# Patient Record
Sex: Male | Born: 1945 | Race: Black or African American | Hispanic: No | Marital: Married | Smoking: Former smoker
Health system: Southern US, Community
[De-identification: ages and names within clinical notes are randomized; demographics above are authoritative.]

## PROBLEM LIST (undated history)

## (undated) DIAGNOSIS — I10 Essential (primary) hypertension: Secondary | ICD-10-CM

## (undated) DIAGNOSIS — G20A1 Parkinson's disease without dyskinesia, without mention of fluctuations: Secondary | ICD-10-CM

## (undated) DIAGNOSIS — G20 Parkinson's disease: Secondary | ICD-10-CM

## (undated) HISTORY — PX: NO PAST SURGERIES: SHX2092

## (undated) HISTORY — DX: Parkinson's disease: G20

---

## 2000-01-01 DIAGNOSIS — I69351 Hemiplegia and hemiparesis following cerebral infarction affecting right dominant side: Secondary | ICD-10-CM | POA: Insufficient documentation

## 2001-05-27 ENCOUNTER — Emergency Department (HOSPITAL_COMMUNITY): Admission: EM | Admit: 2001-05-27 | Discharge: 2001-05-27 | Payer: Self-pay | Admitting: Emergency Medicine

## 2015-01-14 ENCOUNTER — Ambulatory Visit: Payer: Self-pay | Admitting: Physician Assistant

## 2015-01-14 LAB — DOT URINE DIP
Blood: NEGATIVE
Glucose,UR: NEGATIVE
Protein: NEGATIVE
Specific Gravity: 1.005 (ref 1.000–1.030)

## 2016-08-21 ENCOUNTER — Ambulatory Visit
Admission: EM | Admit: 2016-08-21 | Discharge: 2016-08-21 | Disposition: A | Discharge status: Home / Self Care | Payer: Medicare Other | Attending: Family Medicine | Admitting: Family Medicine

## 2016-08-21 DIAGNOSIS — Z029 Encounter for administrative examinations, unspecified: Secondary | ICD-10-CM

## 2016-08-21 DIAGNOSIS — Z024 Encounter for examination for driving license: Secondary | ICD-10-CM

## 2016-08-21 HISTORY — DX: Essential (primary) hypertension: I10

## 2016-08-21 LAB — DEPT OF TRANSP DIPSTICK, URINE (ARMC ONLY)
GLUCOSE, UA: NEGATIVE mg/dL
HGB URINE DIPSTICK: NEGATIVE
Protein, ur: NEGATIVE mg/dL
Specific Gravity, Urine: 1.01 (ref 1.005–1.030)

## 2016-08-21 NOTE — ED Triage Notes (Signed)
Patient is here for DOT physical. Patient has no other complaints at this time.  

## 2016-08-21 NOTE — ED Provider Notes (Signed)
CSN: [id number]     Arrival date & time 08/21/16  1105 History   First MD Initiated Contact with Patient 08/21/16 1216     Chief Complaint  Patient presents with  . Commercial Driver's License Exam   (Consider location/radiation/quality/duration/timing/severity/associated sxs/prior Treatment) HPI  This a 70 year old male presents for a DOT physical. He has a history of hypertension that is controlled by hydrochlorothiazide. I have reviewed his medical history in detail where he had several syncopal episodes that were evaluated in detail and found to be on the basis of medications causing orthostatic hypotension. When the medications were changed he has not had episodes since that period of time. Eustachian he has had a cardiology workup including echocardiogram and a recent nuclear stress test which showed a showed a normal EF no evidence of coronary artery disease. His only medication is for the hydrochlorothiazide.    Past Medical History:  Diagnosis Date  . Hypertension    Past Surgical History:  Procedure Laterality Date  . NO PAST SURGERIES     Family History  Problem Relation Age of Onset  . Dementia Mother   . Heart attack Father [id number]   Social History  Substance Use Topics  . Smoking status: Former Smoker    Quit date: 08/21/2010  . Smokeless tobacco: Never Used  . Alcohol use No    Review of Systems  All other systems reviewed and are negative.   Allergies  Review of patient's allergies indicates no known allergies.  Home Medications   Prior to Admission medications   Medication Sig Start Date End Date Taking? Authorizing Provider  hydrochlorothiazide (HYDRODIURIL) 25 MG tablet Take 25 mg by mouth daily.   Yes Historical Provider, MD   Meds Ordered and Administered this Visit  Medications - No data to display  BP 134/87 (BP Location: Left Arm)   Pulse 81   Temp 98 F (36.7 C) (Oral)   Resp 16   Ht 5' 11.5" (1.816 m)   Wt 181 lb (82.1 kg)   SpO2 99%   BMI  24.89 kg/m  No data found.   Physical Exam  Constitutional: He appears well-developed and well-nourished.  Please refer to DOT physical sheet  Skin: He is not diaphoretic.  Nursing note and vitals reviewed.   Urgent Care Course   Clinical Course    Procedures (including critical care time)  Labs Review Labs Reviewed  DEPT OF TRANSP DIPSTICK, URINE([facility] ONLY)    Imaging Review No results found.   Visual Acuity Review  Right Eye Distance: 20/30 Left Eye Distance: 20/40 Bilateral Distance: 20/25  Right Eye Near:   Left Eye Near:    Bilateral Near:         MDM   1. Driver's permit PE (physical examination)    Patient qualifies for one year certificate with the provision for wearing corrective lenses when driving. His previous syncopal episodes and abnormal echocardiogram were fully evaluated and his syncopal episodes have abated since the medication changes. Nuclear stress test  confirmed a normal EF and no evidence of coronary artery disease. Based on these findings I have given him a one-year certificate.    Lutricia FeilWilliam P Cashel Bellina, PA-C 08/21/16 1325

## 2017-03-28 ENCOUNTER — Ambulatory Visit
Admission: EM | Admit: 2017-03-28 | Discharge: 2017-03-28 | Disposition: A | Discharge status: Home / Self Care | Payer: Medicare Other | Attending: Family Medicine | Admitting: Family Medicine

## 2017-03-28 ENCOUNTER — Ambulatory Visit: Payer: Medicare Other

## 2017-03-28 DIAGNOSIS — J439 Emphysema, unspecified: Secondary | ICD-10-CM | POA: Diagnosis not present

## 2017-03-28 DIAGNOSIS — R0602 Shortness of breath: Secondary | ICD-10-CM

## 2017-03-28 DIAGNOSIS — I700 Atherosclerosis of aorta: Secondary | ICD-10-CM | POA: Insufficient documentation

## 2017-03-28 DIAGNOSIS — R0600 Dyspnea, unspecified: Secondary | ICD-10-CM | POA: Diagnosis not present

## 2017-03-28 DIAGNOSIS — R0902 Hypoxemia: Secondary | ICD-10-CM | POA: Diagnosis not present

## 2017-03-28 DIAGNOSIS — I10 Essential (primary) hypertension: Secondary | ICD-10-CM | POA: Insufficient documentation

## 2017-03-28 DIAGNOSIS — Z87891 Personal history of nicotine dependence: Secondary | ICD-10-CM | POA: Insufficient documentation

## 2017-03-28 MED ORDER — AZITHROMYCIN 500 MG PO TABS: ORAL_TABLET | ORAL | 0 refills | Status: DC

## 2017-03-28 MED ORDER — PREDNISONE 10 MG (21) PO TBPK: ORAL_TABLET | ORAL | 0 refills | Status: DC

## 2017-03-28 MED ORDER — METHYLPREDNISOLONE SODIUM SUCC 125 MG IJ SOLR
125.0000 mg | Freq: Once | INTRAMUSCULAR | Status: AC
  Administered 2017-03-28: 125 mg via INTRAMUSCULAR

## 2017-03-28 MED ORDER — HYDROCOD POLST-CPM POLST ER 10-8 MG/5ML PO SUER: 5.0000 mL | Freq: Two times a day (BID) | ORAL | 0 refills | Status: DC | PRN

## 2017-03-28 MED ORDER — IPRATROPIUM-ALBUTEROL 0.5-2.5 (3) MG/3ML IN SOLN
3.0000 mL | Freq: Once | RESPIRATORY_TRACT | Status: AC
  Administered 2017-03-28: 3 mL via RESPIRATORY_TRACT

## 2017-03-28 MED ORDER — ALBUTEROL SULFATE HFA 108 (90 BASE) MCG/ACT IN AERS: 2.0000 | INHALATION_SPRAY | RESPIRATORY_TRACT | 0 refills | Status: DC | PRN

## 2017-03-28 NOTE — ED Provider Notes (Signed)
[facility]    CSN: [id number] Arrival date & time: 03/28/17  1734     History   Chief Complaint Chief Complaint  Patient presents with  . Shortness of Breath    HPI Jerome Wolfe is a 71 y.o. male.   Patient is here because shortness of breath. According to him and his wife he started getting short of breath on Tuesday. He's never had history of asthma shortness of breath and difficulty breathing. He started smoking at 75 he smoked colorectal basis until about 5 years ago when he stopped. He is in between PCPs at this time. He has never been poor and inhaler albuterol treatment or shortness of breath. States he is coughing up some clear material but nothing this really sick however his wife states that for the last 3 days he is progressively got short of breath and she can hear wheezing at night. No known drug allergies and once again he just stopped smoking about 5 years. No known drug allergies. No previous surgeries operations. Other than hypertension no chronic medical problems. Mother had dementia father had heart attack.   The history is provided by the patient and the spouse.  Shortness of Breath  Severity:  Severe Duration:  3 days Timing:  Constant Progression:  Worsening Chronicity:  New Context: activity, URI and weather changes   Context: not smoke exposure   Relieved by:  Nothing Worsened by:  Nothing Ineffective treatments:  None tried Associated symptoms: wheezing   Associated symptoms: no chest pain   Risk factors: tobacco use     Past Medical History:  Diagnosis Date  . Hypertension     There are no active problems to display for this patient.   Past Surgical History:  Procedure Laterality Date  . NO PAST SURGERIES         Home Medications    Prior to Admission medications   Medication Sig Start Date End Date Taking? Authorizing Provider  hydrochlorothiazide (HYDRODIURIL) 25 MG tablet Take 25 mg by mouth daily.    Historical  Provider, MD    Family History Family History  Problem Relation Age of Onset  . Dementia Mother   . Heart attack Father 61    Social History Social History  Substance Use Topics  . Smoking status: Former Smoker    Quit date: 08/21/2010  . Smokeless tobacco: Never Used  . Alcohol use No     Allergies   Patient has no known allergies.   Review of Systems Review of Systems  Constitutional: Positive for activity change, appetite change and fatigue.  Respiratory: Positive for chest tightness, shortness of breath and wheezing.   Cardiovascular: Negative for chest pain.     Physical Exam Triage Vital Signs ED Triage Vitals [03/28/17 1740]  Enc Vitals Group     BP (!) 169/96     Pulse Rate (!) 107     Resp 18     Temp      Temp Source Oral     SpO2 94 %     Weight      Height      Head Circumference      Peak Flow      Pain Score 0     Pain Loc      Pain Edu?      Excl. in GC?    No data found.   Updated Vital Signs BP (!) 169/96 (BP Location: Left Arm)   Pulse (!) 107  Resp 18   SpO2 97%   Visual Acuity Right Eye Distance:   Left Eye Distance:   Bilateral Distance:    Right Eye Near:   Left Eye Near:    Bilateral Near:     Physical Exam  Constitutional: He appears well-developed and well-nourished. No distress.  HENT:  Head: Normocephalic and atraumatic.  Right Ear: External ear normal.  Left Ear: External ear normal.  Eyes: EOM are normal. Pupils are equal, round, and reactive to light.  Neck: Normal range of motion. Neck supple. No tracheal deviation present.  Cardiovascular: Exam reveals distant heart sounds.   Pulmonary/Chest: He has decreased breath sounds. He has wheezes.  Musculoskeletal: Normal range of motion.  Neurological: He is alert.  Skin: Skin is warm.  Vitals reviewed.    UC Treatments / Results  Labs (all labs ordered are listed, but only abnormal results are displayed) Labs Reviewed - No data to display  EKG  EKG  Interpretation None       Radiology No results found.  Procedures Procedures (including critical care time)  Medications Ordered in UC Medications  methylPREDNISolone sodium succinate (SOLU-MEDROL) 125 mg/2 mL injection 125 mg (not administered)  ipratropium-albuterol (DUONEB) 0.5-2.5 (3) MG/3ML nebulizer solution 3 mL (3 mLs Nebulization Given 03/28/17 1743)     Initial Impression / Assessment and Plan / UC Course  I have reviewed the triage vital signs and the nursing notes.  Pertinent labs & imaging results that were available during my care of the patient were reviewed by me and considered in my medical decision making (see chart for details).   patient is markedly improved from when he first arrived he still has shortness of breath and his pulse ox is ranging from 93-96% will also administer 125 mg Solu-Medrol and his rate being given a DuoNeb treatment. Will obtain a chest x-ray place him on 6 day course of prednisone and antibiotics will be determined by what the chest x-ray shows. Pneumonia Levaquin no pneumonia we'll place him on Zithromax 500 mg daily for 5 days may give him a even a test next prescription with albuterol inhaler. Main thing though as I talked to him and his wife extensively this mass smoked for 45+ years he stopped smoking 5 years ago and started when he was [age]. He has never been diagnosed as COPD he's never had shortness of breath issue but I explained to him and his wife that even though he stopped smoking 5 years ago sometimes takes years for the COPD symptoms to start and S1 think is going on now strongly suggest he follow-up with PCP ASAP so that he might go as COPD medication and have further testing to diagnose whether this is truly COPD or not    Final Clinical Impressions(s) / UC Diagnoses   Final diagnoses:  Shortness of breath  Dyspnea, unspecified type  Hypoxia    New Prescriptions New Prescriptions   No medications on file  Note: This  dictation was prepared with Dragon dictation along with smaller phrase technology. Any transcriptional errors that result from this process are unintentional.  Chest x-ray did show emphysematous changes will place him on Zithromax Tussionex albuterol inhaler and prednisone by mouth    Hassan RowanEugene Mauriah Mcmillen, MD 03/28/17 1907

## 2017-03-28 NOTE — ED Triage Notes (Signed)
Pt c/o shortness of breath for 2 days following chest congestion.

## 2019-07-21 DIAGNOSIS — I714 Abdominal aortic aneurysm, without rupture, unspecified: Secondary | ICD-10-CM | POA: Diagnosis present

## 2019-08-22 ENCOUNTER — Emergency Department: Payer: Medicare Other

## 2019-08-22 ENCOUNTER — Inpatient Hospital Stay
Admission: EM | Admit: 2019-08-22 | Discharge: 2019-08-25 | DRG: 065 | Disposition: A | Discharge status: Skilled Nursing Facility | Payer: Medicare Other | Attending: Internal Medicine | Admitting: Internal Medicine

## 2019-08-22 ENCOUNTER — Observation Stay: Payer: Medicare Other

## 2019-08-22 ENCOUNTER — Other Ambulatory Visit: Payer: Self-pay

## 2019-08-22 DIAGNOSIS — Z87891 Personal history of nicotine dependence: Secondary | ICD-10-CM

## 2019-08-22 DIAGNOSIS — F4310 Post-traumatic stress disorder, unspecified: Secondary | ICD-10-CM | POA: Diagnosis present

## 2019-08-22 DIAGNOSIS — R4781 Slurred speech: Secondary | ICD-10-CM | POA: Diagnosis present

## 2019-08-22 DIAGNOSIS — Z79899 Other long term (current) drug therapy: Secondary | ICD-10-CM

## 2019-08-22 DIAGNOSIS — Z20828 Contact with and (suspected) exposure to other viral communicable diseases: Secondary | ICD-10-CM | POA: Diagnosis present

## 2019-08-22 DIAGNOSIS — I6381 Other cerebral infarction due to occlusion or stenosis of small artery: Principal | ICD-10-CM | POA: Diagnosis present

## 2019-08-22 DIAGNOSIS — Z820 Family history of epilepsy and other diseases of the nervous system: Secondary | ICD-10-CM

## 2019-08-22 DIAGNOSIS — I10 Essential (primary) hypertension: Secondary | ICD-10-CM | POA: Diagnosis present

## 2019-08-22 DIAGNOSIS — E785 Hyperlipidemia, unspecified: Secondary | ICD-10-CM | POA: Diagnosis present

## 2019-08-22 DIAGNOSIS — Z8249 Family history of ischemic heart disease and other diseases of the circulatory system: Secondary | ICD-10-CM

## 2019-08-22 DIAGNOSIS — I452 Bifascicular block: Secondary | ICD-10-CM | POA: Diagnosis present

## 2019-08-22 DIAGNOSIS — I639 Cerebral infarction, unspecified: Secondary | ICD-10-CM | POA: Diagnosis present

## 2019-08-22 DIAGNOSIS — G8191 Hemiplegia, unspecified affecting right dominant side: Secondary | ICD-10-CM | POA: Diagnosis present

## 2019-08-22 DIAGNOSIS — R4587 Impulsiveness: Secondary | ICD-10-CM | POA: Diagnosis not present

## 2019-08-22 DIAGNOSIS — G459 Transient cerebral ischemic attack, unspecified: Secondary | ICD-10-CM

## 2019-08-22 DIAGNOSIS — R29703 NIHSS score 3: Secondary | ICD-10-CM | POA: Diagnosis present

## 2019-08-22 DIAGNOSIS — Z8489 Family history of other specified conditions: Secondary | ICD-10-CM

## 2019-08-22 LAB — COMPREHENSIVE METABOLIC PANEL
ALT: 16 U/L (ref 0–44)
AST: 23 U/L (ref 15–41)
Albumin: 4.6 g/dL (ref 3.5–5.0)
Alkaline Phosphatase: 67 U/L (ref 38–126)
Anion gap: 11 (ref 5–15)
BUN: 15 mg/dL (ref 8–23)
CO2: 23 mmol/L (ref 22–32)
Calcium: 9.7 mg/dL (ref 8.9–10.3)
Chloride: 104 mmol/L (ref 98–111)
Creatinine, Ser: 1.12 mg/dL (ref 0.61–1.24)
GFR calc Af Amer: >60 mL/min (ref >60)
GFR calc non Af Amer: >60 mL/min (ref >60)
Glucose, Bld: 97 mg/dL (ref 70–99)
Potassium: 3.8 mmol/L (ref 3.5–5.1)
Sodium: 138 mmol/L (ref 135–145)
Total Bilirubin: 0.7 mg/dL (ref 0.3–1.2)
Total Protein: 8.4 g/dL — ABNORMAL HIGH (ref 6.5–8.1)

## 2019-08-22 LAB — URINALYSIS, COMPLETE (UACMP) WITH MICROSCOPIC
Bacteria, UA: NONE SEEN
Bilirubin Urine: NEGATIVE
Glucose, UA: NEGATIVE mg/dL
Hgb urine dipstick: NEGATIVE
Ketones, ur: NEGATIVE mg/dL
Leukocytes,Ua: NEGATIVE
Nitrite: NEGATIVE
Protein, ur: NEGATIVE mg/dL
Specific Gravity, Urine: 1.01 (ref 1.005–1.030)
Squamous Epithelial / LPF: NONE SEEN (ref 0–5)
pH: 6 (ref 5.0–8.0)

## 2019-08-22 LAB — CBC WITH DIFFERENTIAL/PLATELET
Abs Immature Granulocytes: 0.02 10*3/uL (ref 0.00–0.07)
Basophils Absolute: 0.1 10*3/uL (ref 0.0–0.1)
Basophils Relative: 1 %
Eosinophils Absolute: 0.1 10*3/uL (ref 0.0–0.5)
Eosinophils Relative: 2 %
HCT: 46.2 % (ref 39.0–52.0)
Hemoglobin: 14.5 g/dL (ref 13.0–17.0)
Immature Granulocytes: 0 %
Lymphocytes Relative: 27 %
Lymphs Abs: 2.3 10*3/uL (ref 0.7–4.0)
MCH: 21.7 pg — ABNORMAL LOW (ref 26.0–34.0)
MCHC: 31.4 g/dL (ref 30.0–36.0)
MCV: 69.2 fL — ABNORMAL LOW (ref 80.0–100.0)
Monocytes Absolute: 0.8 10*3/uL (ref 0.1–1.0)
Monocytes Relative: 10 %
Neutro Abs: 5.2 10*3/uL (ref 1.7–7.7)
Neutrophils Relative %: 60 %
Platelets: 267 10*3/uL (ref 150–400)
RBC: 6.68 MIL/uL — ABNORMAL HIGH (ref 4.22–5.81)
RDW: 17.7 % — ABNORMAL HIGH (ref 11.5–15.5)
Smear Review: NORMAL
WBC Morphology: ABNORMAL
WBC: 8.6 10*3/uL (ref 4.0–10.5)
nRBC: 0 % (ref 0.0–0.2)

## 2019-08-22 MED ORDER — ALBUTEROL SULFATE HFA 108 (90 BASE) MCG/ACT IN AERS
2.0000 | INHALATION_SPRAY | RESPIRATORY_TRACT | Status: DC | PRN
Start: 2019-08-22 — End: 2019-08-26
  Filled 2019-08-22: qty 6.7

## 2019-08-22 MED ORDER — SENNOSIDES-DOCUSATE SODIUM 8.6-50 MG PO TABS: 1.0000 | ORAL_TABLET | Freq: Every evening | ORAL | Status: DC | PRN

## 2019-08-22 MED ORDER — ACETAMINOPHEN 160 MG/5ML PO SOLN
650.0000 mg | ORAL | Status: DC | PRN
  Filled 2019-08-22: qty 20.3

## 2019-08-22 MED ORDER — LORAZEPAM 2 MG/ML IJ SOLN: 1.0000 mg | Freq: Once | INTRAMUSCULAR | Status: DC | PRN

## 2019-08-22 MED ORDER — ATORVASTATIN CALCIUM 20 MG PO TABS
40.0000 mg | ORAL_TABLET | Freq: Every day | ORAL | Status: DC
  Administered 2019-08-22: 40 mg via ORAL
  Filled 2019-08-22: qty 2

## 2019-08-22 MED ORDER — STROKE: EARLY STAGES OF RECOVERY BOOK
Freq: Once | Status: AC
  Administered 2019-08-22: 18:00:00

## 2019-08-22 MED ORDER — ASPIRIN EC 81 MG PO TBEC
81.0000 mg | DELAYED_RELEASE_TABLET | Freq: Every day | ORAL | Status: DC
  Administered 2019-08-23 – 2019-08-25 (×3): 81 mg via ORAL
  Filled 2019-08-22 (×4): qty 1

## 2019-08-22 MED ORDER — ASPIRIN 325 MG PO TABS
325.0000 mg | ORAL_TABLET | Freq: Once | ORAL | Status: AC
  Administered 2019-08-22: 325 mg via ORAL
  Filled 2019-08-22: qty 1

## 2019-08-22 MED ORDER — ENOXAPARIN SODIUM 40 MG/0.4ML ~~LOC~~ SOLN
40.0000 mg | SUBCUTANEOUS | Status: DC
  Administered 2019-08-22 – 2019-08-25 (×4): 40 mg via SUBCUTANEOUS
  Filled 2019-08-22 (×4): qty 0.4

## 2019-08-22 MED ORDER — ACETAMINOPHEN 325 MG PO TABS: 650.0000 mg | ORAL_TABLET | ORAL | Status: DC | PRN

## 2019-08-22 MED ORDER — ASPIRIN 300 MG RE SUPP: 300.0000 mg | Freq: Once | RECTAL | Status: AC

## 2019-08-22 MED ORDER — ACETAMINOPHEN 650 MG RE SUPP: 650.0000 mg | RECTAL | Status: DC | PRN

## 2019-08-22 NOTE — ED Notes (Signed)
Pt taken to CT via stretcher, wife at bedside.

## 2019-08-22 NOTE — ED Notes (Signed)
Dr. Brett Albino at bedside.   Pt had urinated on self a little. Wet clothes removed, given dry brief. Finished urinating into urinal.

## 2019-08-22 NOTE — Progress Notes (Signed)
Family Meeting Note  Advance Directive:yes  Today a meeting took place with the Patient and spouse.  Patient is able to participate.  The following clinical team members were present during this meeting:MD  The following were discussed:Patient's diagnosis: TIA, Patient's progosis: Unable to determine and Goals for treatment: Full Code  Additional follow-up to be provided: prn  Time spent during discussion:20 minutes  Evette Doffing, MD

## 2019-08-22 NOTE — ED Triage Notes (Signed)
Pt arrives via [facility] from home for "stroke". Family told EMS that pt has been having weakness on L side and then have weakness on R side. Also intermittent speech problems. States hx dementia, COPD. Symptoms x 3-4 days. Has had a fall recently, was able to move into position to fall on bottom per EMS.   Pt arrives to ED denying any weakness at this time. States earlier speech was slurred but states he feels his speech is normal at this time. Denies vision problems. Moving all extremities. EMS states he did walk with assistance at home. Denies pain.   EMS VS: 155/95, HR 97, 97% RA, CBG 96.

## 2019-08-22 NOTE — ED Notes (Signed)
Pt given a warm blanket 

## 2019-08-22 NOTE — ED Provider Notes (Signed)
[facility] Emergency Department Provider Note    First MD Initiated Contact with Patient 08/22/19 1328     (approximate)  I have reviewed the triage vital signs and the nursing notes.   HISTORY  Chief Complaint Weakness    HPI Jerome Wolfe is a 73 y.o. male below listed past medical history presents the ER for intermittent slurred speech as well as stumbling gait.  States he was having intermittent episodes of this last night.  States he had more profound episode this morning that was witnessed by his wife who is currently present with the patient.  He denies any pain.  States that he does feel weak.  Wife states that he felt very warm to touch and was having chills last night.  No new medication changes.  Does not take any blood thinners.  No history of stroke.    Past Medical History:  Diagnosis Date  . Hypertension    Family History  Problem Relation Age of Onset  . Dementia Mother   . Heart attack Father 70   Past Surgical History:  Procedure Laterality Date  . NO PAST SURGERIES     There are no active problems to display for this patient.     Prior to Admission medications   Medication Sig Start Date End Date Taking? Authorizing Provider  albuterol (PROVENTIL HFA;VENTOLIN HFA) 108 (90 Base) MCG/ACT inhaler Inhale 2 puffs into the lungs every 4 (four) hours as needed for wheezing or shortness of breath. 03/28/17   Frederich Cha, MD  azithromycin (ZITHROMAX) 500 MG tablet 1 tablet daily for 5 days 03/28/17   Frederich Cha, MD  chlorpheniramine-HYDROcodone [facility] PENNKINETIC ER) 10-8 MG/5ML SUER Take 5 mLs by mouth every 12 (twelve) hours as needed for cough. 03/28/17   Frederich Cha, MD  hydrochlorothiazide (HYDRODIURIL) 25 MG tablet Take 25 mg by mouth daily.    [provider]  predniSONE (STERAPRED UNI-PAK 21 TAB) 10 MG (21) TBPK tablet Sig 6 tablet day 1, 5 tablets day 2, 4 tablets day 3,,3tablets day 4, 2 tablets day 5, 1 tablet day  6 take all tablets orally 03/28/17   Frederich Cha, MD    Allergies Patient has no known allergies.    Social History Social History   Tobacco Use  . Smoking status: Former Smoker    Quit date: 08/21/2010    Years since quitting: 9.0  . Smokeless tobacco: Never Used  Substance Use Topics  . Alcohol use: No  . Drug use: No    Review of Systems Patient denies headaches, rhinorrhea, blurry vision, numbness, shortness of breath, chest pain, edema, cough, abdominal pain, nausea, vomiting, diarrhea, dysuria, fevers, rashes or hallucinations unless otherwise stated above in HPI. ____________________________________________   PHYSICAL EXAM:  VITAL SIGNS: Vitals:   08/22/19 1336 08/22/19 1402  BP: (!) 160/134   Pulse:    Resp:    Temp:  98.5 F (36.9 C)  SpO2:      Constitutional: Alert and oriented.  Eyes: Conjunctivae are normal.  Head: Atraumatic. Nose: No congestion/rhinnorhea. Mouth/Throat: Mucous membranes are moist.   Neck: No stridor. Painless ROM.  Cardiovascular: Normal rate, regular rhythm. Grossly normal heart sounds.  Good peripheral circulation. Respiratory: Normal respiratory effort.  No retractions. Lungs CTAB. Gastrointestinal: Soft and nontender. No distention. No abdominal bruits. No CVA tenderness. Genitourinary: deferred Musculoskeletal: No lower extremity tenderness nor edema.  No joint effusions. Neurologic:  CN- intact.  No facial droop, Normal FNF.  Normal heel to shin.  Sensation intact bilaterally. Normal speech and language. No gross focal neurologic deficits are appreciated. No gait instability. Skin:  Skin is warm, dry and intact. No rash noted. Psychiatric: Mood and affect are normal. Speech and behavior are normal.  ____________________________________________   LABS (all labs ordered are listed, but only abnormal results are displayed)  Results for orders placed or performed during the hospital encounter of 08/22/19 (from the past 24  hour(s))  CBC with Differential/Platelet     Status: Abnormal   Collection Time: 08/22/19  1:34 PM  Result Value Ref Range   WBC 8.6 4.0 - 10.5 K/uL   RBC 6.68 (H) 4.22 - 5.81 MIL/uL   Hemoglobin 14.5 13.0 - 17.0 g/dL   HCT 16.146.2 09.639.0 - 04.552.0 %   MCV 69.2 (L) 80.0 - 100.0 fL   MCH 21.7 (L) 26.0 - 34.0 pg   MCHC 31.4 30.0 - 36.0 g/dL   RDW 40.917.7 (H) 81.111.5 - 91.415.5 %   Platelets 267 150 - 400 K/uL   nRBC 0.0 0.0 - 0.2 %   Neutrophils Relative % 60 %   Neutro Abs 5.2 1.7 - 7.7 K/uL   Lymphocytes Relative 27 %   Lymphs Abs 2.3 0.7 - 4.0 K/uL   Monocytes Relative 10 %   Monocytes Absolute 0.8 0.1 - 1.0 K/uL   Eosinophils Relative 2 %   Eosinophils Absolute 0.1 0.0 - 0.5 K/uL   Basophils Relative 1 %   Basophils Absolute 0.1 0.0 - 0.1 K/uL   WBC Morphology Abnormal lymphocytes present    RBC Morphology MORPHOLOGY UNREMARKABLE    Smear Review Normal platelet morphology    Immature Granulocytes 0 %   Abs Immature Granulocytes 0.02 0.00 - 0.07 K/uL  Comprehensive metabolic panel     Status: Abnormal   Collection Time: 08/22/19  1:34 PM  Result Value Ref Range   Sodium 138 135 - 145 mmol/L   Potassium 3.8 3.5 - 5.1 mmol/L   Chloride 104 98 - 111 mmol/L   CO2 23 22 - 32 mmol/L   Glucose, Bld 97 70 - 99 mg/dL   BUN 15 8 - 23 mg/dL   Creatinine, Ser 7.821.12 0.61 - 1.24 mg/dL   Calcium 9.7 8.9 - 95.610.3 mg/dL   Total Protein 8.4 (H) 6.5 - 8.1 g/dL   Albumin 4.6 3.5 - 5.0 g/dL   AST 23 15 - 41 U/L   ALT 16 0 - 44 U/L   Alkaline Phosphatase 67 38 - 126 U/L   Total Bilirubin 0.7 0.3 - 1.2 mg/dL   GFR calc non Af Amer >60 >60 mL/min   GFR calc Af Amer >60 >60 mL/min   Anion gap 11 5 - 15  Urinalysis, Complete w Microscopic     Status: Abnormal   Collection Time: 08/22/19  1:53 PM  Result Value Ref Range   Color, Urine YELLOW (A) YELLOW   APPearance CLEAR (A) CLEAR   Specific Gravity, Urine 1.010 1.005 - 1.030   pH 6.0 5.0 - 8.0   Glucose, UA NEGATIVE NEGATIVE mg/dL   Hgb urine dipstick  NEGATIVE NEGATIVE   Bilirubin Urine NEGATIVE NEGATIVE   Ketones, ur NEGATIVE NEGATIVE mg/dL   Protein, ur NEGATIVE NEGATIVE mg/dL   Nitrite NEGATIVE NEGATIVE   Leukocytes,Ua NEGATIVE NEGATIVE   WBC, UA 0-5 0 - 5 WBC/hpf   Bacteria, UA NONE SEEN NONE SEEN   Squamous Epithelial / LPF NONE SEEN 0 - 5   ____________________________________________  EKG My review and personal interpretation  at Time: 13:33   Indication: slurred speech, weakness  Rate: 95  Rhythm: sinus Axis: left Other: rbbb, lafb, no stemi, nonspecific st abn ____________________________________________  RADIOLOGY  I personally reviewed all radiographic images ordered to evaluate for the above acute complaints and reviewed radiology reports and findings.  These findings were personally discussed with the patient.  Please see medical record for radiology report.  ____________________________________________   PROCEDURES  Procedure(s) performed:  Procedures    Critical Care performed: no ____________________________________________   INITIAL IMPRESSION / ASSESSMENT AND PLAN / ED COURSE  Pertinent labs & imaging results that were available during my care of the patient were reviewed by me and considered in my medical decision making (see chart for details).   DDX: Dehydration, sepsis, pna, uti, hypoglycemia, cva, drug effect, withdrawal, encephalitis   Harden Moddie Lepera is a 73 y.o. who presents to the ED with symptoms as described above.  Symptoms with onset outside of the window.  Does not meet criteria for code stroke.  Blood work sent for by differential was reassuring.  No evidence of cystitis.  Is not febrile.  No history suggestive withdrawal or medication effect.  Does not seem consistent with encephalitis.  Given the episodic nature of his symptoms with lateralizing features that is since resolved certainly concerning for TIA.  Will discuss with hospitalist for admission.     The patient was evaluated in  Emergency Department today for the symptoms described in the history of present illness. He/she was evaluated in the context of the global COVID-19 pandemic, which necessitated consideration that the patient might be at risk for infection with the SARS-CoV-2 virus that causes COVID-19. Institutional protocols and algorithms that pertain to the evaluation of patients at risk for COVID-19 are in a state of rapid change based on information released by regulatory bodies including the CDC and federal and state organizations. These policies and algorithms were followed during the patient's care in the ED.  As part of my medical decision making, I reviewed the following data within the electronic MEDICAL RECORD NUMBER Nursing notes reviewed and incorporated, Labs reviewed, notes from prior ED visits and [place] Controlled Substance Database   ____________________________________________   FINAL CLINICAL IMPRESSION(S) / ED DIAGNOSES  Final diagnoses:  TIA (transient ischemic attack)      NEW MEDICATIONS STARTED DURING THIS VISIT:  New Prescriptions   No medications on file     Note:  This document was prepared using Dragon voice recognition software and may include unintentional dictation errors.    Willy Eddyobinson, Ming Mcmannis, MD 08/22/19 [phone]

## 2019-08-22 NOTE — H&P (Signed)
[facility] [place] [facility]   PATIENT NAME: Jerome Wolfe    MR#:  [id number]  DATE OF BIRTH:  1946/07/01  DATE OF ADMISSION:  08/22/2019  PRIMARY CARE PHYSICIAN: [facility]   REQUESTING/REFERRING PHYSICIAN: Willy EddyPatrick Robinson, MD  CHIEF COMPLAINT:   Chief Complaint  Patient presents with   Weakness    HISTORY OF PRESENT ILLNESS:  Jerome Wolfe  is a 73 y.o. male with a known history of hypertension who presented to the ED with slurred speech and right sided weakness that started around 8 AM this morning.  Per wife, patient began having slurred speech when they were drinking coffee this morning.  He then went to go comb his hair, but could not lift his right arm up to his head.  He denies any numbness or weakness of his arms or legs.  Patient does note that prior to going to bed last night, he felt some generalized weakness in all of his extremities.  He denies any vision changes.  His symptoms resolved after about 1-2 hours.  In the ED, he was hypertensive to 165/93. CT head was negative. CXR did not show any acute abnormalities. Hospitalists were called for admission.  PAST MEDICAL HISTORY:   Past Medical History:  Diagnosis Date   Hypertension     PAST SURGICAL HISTORY:   Past Surgical History:  Procedure Laterality Date   NO PAST SURGERIES      SOCIAL HISTORY:   Social History   Tobacco Use   Smoking status: Former Smoker    Quit date: 08/21/2010    Years since quitting: 9.0   Smokeless tobacco: Never Used  Substance Use Topics   Alcohol use: No    FAMILY HISTORY:   Family History  Problem Relation Age of Onset   Dementia Mother    Heart attack Father 2655    DRUG ALLERGIES:  No Known Allergies  REVIEW OF SYSTEMS:   Review of Systems  Constitutional: Negative for chills and fever.  HENT: Negative for congestion and sore throat.   Eyes: Negative for blurred vision and double vision.  Respiratory: Negative  for cough and shortness of breath.   Cardiovascular: Negative for chest pain and palpitations.  Gastrointestinal: Negative for nausea and vomiting.  Genitourinary: Negative for dysuria and urgency.  Musculoskeletal: Negative for back pain and neck pain.  Neurological: Positive for speech change and focal weakness. Negative for dizziness, tingling and headaches.  Psychiatric/Behavioral: Negative for depression. The patient is not nervous/anxious.     MEDICATIONS AT HOME:   Prior to Admission medications   Medication Sig Start Date End Date Taking? Authorizing Provider  albuterol (PROVENTIL HFA;VENTOLIN HFA) 108 (90 Base) MCG/ACT inhaler Inhale 2 puffs into the lungs every 4 (four) hours as needed for wheezing or shortness of breath. 03/28/17   Hassan RowanWade, Eugene, MD  azithromycin (ZITHROMAX) 500 MG tablet 1 tablet daily for 5 days 03/28/17   Hassan RowanWade, Eugene, MD  chlorpheniramine-HYDROcodone [facility](TUSSIONEX PENNKINETIC ER) 10-8 MG/5ML SUER Take 5 mLs by mouth every 12 (twelve) hours as needed for cough. 03/28/17   Hassan RowanWade, Eugene, MD  hydrochlorothiazide (HYDRODIURIL) 25 MG tablet Take 25 mg by mouth daily.    [provider]  predniSONE (STERAPRED UNI-PAK 21 TAB) 10 MG (21) TBPK tablet Sig 6 tablet day 1, 5 tablets day 2, 4 tablets day 3,,3tablets day 4, 2 tablets day 5, 1 tablet day 6 take all tablets orally 03/28/17   Hassan RowanWade, Eugene, MD  VITAL SIGNS:  Blood pressure (!) 147/81, pulse 95, temperature 98.5 F (36.9 C), temperature source Rectal, resp. rate (!) 31, height 6\' 2"  (1.88 m), weight 80.7 kg, SpO2 97 %.  PHYSICAL EXAMINATION:  Physical Exam  GENERAL:  73 y.o.-year-old patient lying in the bed with no acute distress.  EYES: Pupils equal, round, reactive to light and accommodation. No scleral icterus. Extraocular muscles intact.  HEENT: Head atraumatic, normocephalic. Oropharynx and nasopharynx clear.  NECK:  Supple, no jugular venous distention. No thyroid enlargement, no tenderness.   LUNGS: Normal breath sounds bilaterally, no wheezing, rales,rhonchi or crepitation. No use of accessory muscles of respiration.  CARDIOVASCULAR: RRR, S1, S2 normal. No murmurs, rubs, or gallops.  ABDOMEN: Soft, nontender, nondistended. Bowel sounds present. No organomegaly or mass.  EXTREMITIES: No pedal edema, cyanosis, or clubbing.  NEUROLOGIC: Cranial nerves II through XII are intact. Muscle strength 5/5 in all extremities. Sensation intact. Gait not checked.  Speech normal. PSYCHIATRIC: The patient is alert and oriented x 3.  SKIN: No obvious rash, lesion, or ulcer.   LABORATORY PANEL:   CBC Recent Labs  Lab 08/22/19 1334  WBC 8.6  HGB 14.5  HCT 46.2  PLT 267   ------------------------------------------------------------------------------------------------------------------  Chemistries  Recent Labs  Lab 08/22/19 1334  NA 138  K 3.8  CL 104  CO2 23  GLUCOSE 97  BUN 15  CREATININE 1.12  CALCIUM 9.7  AST 23  ALT 16  ALKPHOS 67  BILITOT 0.7   ------------------------------------------------------------------------------------------------------------------  Cardiac Enzymes No results for input(s): TROPONINI in the last 168 hours. ------------------------------------------------------------------------------------------------------------------  RADIOLOGY:  Ct Head Wo Contrast  Result Date: 08/22/2019 CLINICAL DATA:  Weakness, intermittent speech difficulty, dementia EXAM: CT HEAD WITHOUT CONTRAST TECHNIQUE: Contiguous axial images were obtained from the base of the skull through the vertex without intravenous contrast. COMPARISON:  None. FINDINGS: Brain: No evidence of acute infarction, hemorrhage, hydrocephalus, extra-axial collection or mass lesion/mass effect. Subcortical white matter and periventricular small vessel ischemic changes. Vascular: No hyperdense vessel or unexpected calcification. Skull: Normal. Negative for fracture or focal lesion. Sinuses/Orbits: The  visualized paranasal sinuses are essentially clear. The mastoid air cells are unopacified. Other: None. IMPRESSION: No evidence of acute intracranial abnormality. Small vessel ischemic changes. Electronically Signed   By: Julian Hy M.D.   On: 08/22/2019 14:15   Dg Chest Portable 1 View  Result Date: 08/22/2019 CLINICAL DATA:  AMS, evaluate for pneumonia EXAM: PORTABLE CHEST 1 VIEW COMPARISON:  03/28/2017 FINDINGS: The heart size and mediastinal contours are within normal limits. Emphysema. Probable mild bibasilar fibrotic change. No acute appearing airspace opacity. The visualized skeletal structures are unremarkable. IMPRESSION: Emphysema. Probable mild bibasilar fibrotic change. No acute appearing airspace opacity. Electronically Signed   By: Kolson Candle M.D.   On: 08/22/2019 14:24      IMPRESSION AND PLAN:   Slurred speech/right arm weakness- likely TIA.  Symptoms lasted for 1-2 hours and then resolved spontaneously. -CT head negative -Will obtain MRI brain -ECHO ([facility]) and carotid dopplers -Neurology consult -A1c and lipid panel  -Start daily aspirin and lipitor -PT/OT/SLP -Cardiac monitoring  Hypertension-BP mildly elevated in the ED -Will allow for permissive hypertension -Holding home hydrochlorothiazide for now   All the records are reviewed and case discussed with ED provider. Management plans discussed with the patient, family and they are in agreement.  CODE STATUS: full  TOTAL TIME TAKING CARE OF THIS PATIENT: 45 minutes.    Berna Spare Alonia Dibuono M.D on 08/22/2019 at 2:53 PM  Between 7am  to 6pm - Pager - [phone]  After 6pm go to [email or web address] - Social research officer, governmentpassword [email or web address] [email or web address]  [facility] [place] [facility]  Office  [phone]  CC: Primary care physician; [facility]   Note: This dictation was prepared with Dragon dictation along with smaller phrase technology. Any transcriptional errors that result from this process are unintentional.

## 2019-08-22 NOTE — ED Notes (Signed)
ED TO INPATIENT HANDOFF REPORT  ED Nurse Name and Phone #:  Jae DireKate & Alyssa [phone]  S Name/Age/Gender Jerome Wolfe 73 y.o. male Room/Bed: ED13A/ED13A  Code Status   Code Status: Not on file  Home/SNF/Other Home Patient oriented to: self, place, time and situation Is this baseline? Yes   Triage Complete: Triage complete  Chief Complaint weakness  Triage Note Pt arrives via [facility] from home for "stroke". Family told EMS that pt has been having weakness on L side and then have weakness on R side. Also intermittent speech problems. States hx dementia, COPD. Symptoms x 3-4 days. Has had a fall recently, was able to move into position to fall on bottom per EMS.   Pt arrives to ED denying any weakness at this time. States earlier speech was slurred but states he feels his speech is normal at this time. Denies vision problems. Moving all extremities. EMS states he did walk with assistance at home. Denies pain.   EMS VS: 155/95, HR 97, 97% RA, CBG 96.    Allergies No Known Allergies  Level of Care/Admitting Diagnosis ED Disposition    ED Disposition Condition Comment   Admit  Hospital Area: [facility] [[id number]]  Level of Care: Med-Surg [16]  Covid Evaluation: Asymptomatic Screening Protocol (No Symptoms)  Diagnosis: Slurred speech [161096][[id number]]  Admitting Physician: Willadean CarolMAYO, KATY DODD [[id number]]  Attending Physician: Willadean CarolMAYO, KATY DODD [[id number]]  PT Class (Do Not Modify): Observation [104]  PT Acc Code (Do Not Modify): Observation [10022]       B Medical/Surgery History Past Medical History:  Diagnosis Date  . Hypertension    Past Surgical History:  Procedure Laterality Date  . NO PAST SURGERIES       A IV Location/Drains/Wounds Patient Lines/Drains/Airways Status   Active Line/Drains/Airways    Name:   Placement date:   Placement time:   Site:   Days:   Peripheral IV 08/22/19 Left Antecubital   08/22/19    1336    Antecubital   less than 1           Intake/Output Last 24 hours No intake or output data in the 24 hours ending 08/22/19 1632  Labs/Imaging Results for orders placed or performed during the hospital encounter of 08/22/19 (from the past 48 hour(s))  CBC with Differential/Platelet     Status: Abnormal   Collection Time: 08/22/19  1:34 PM  Result Value Ref Range   WBC 8.6 4.0 - 10.5 K/uL   RBC 6.68 (H) 4.22 - 5.81 MIL/uL   Hemoglobin 14.5 13.0 - 17.0 g/dL   HCT 29.546.2 62.139.0 - 30.852.0 %   MCV 69.2 (L) 80.0 - 100.0 fL   MCH 21.7 (L) 26.0 - 34.0 pg   MCHC 31.4 30.0 - 36.0 g/dL   RDW 65.717.7 (H) 84.611.5 - 96.215.5 %   Platelets 267 150 - 400 K/uL   nRBC 0.0 0.0 - 0.2 %   Neutrophils Relative % 60 %   Neutro Abs 5.2 1.7 - 7.7 K/uL   Lymphocytes Relative 27 %   Lymphs Abs 2.3 0.7 - 4.0 K/uL   Monocytes Relative 10 %   Monocytes Absolute 0.8 0.1 - 1.0 K/uL   Eosinophils Relative 2 %   Eosinophils Absolute 0.1 0.0 - 0.5 K/uL   Basophils Relative 1 %   Basophils Absolute 0.1 0.0 - 0.1 K/uL   WBC Morphology Abnormal lymphocytes present    RBC Morphology MORPHOLOGY UNREMARKABLE    Smear Review Normal platelet morphology  Immature Granulocytes 0 %   Abs Immature Granulocytes 0.02 0.00 - 0.07 K/uL    Comment: Performed at [facility], [address] [place], [place] [address]  Comprehensive metabolic panel     Status: Abnormal   Collection Time: 08/22/19  1:34 PM  Result Value Ref Range   Sodium 138 135 - 145 mmol/L   Potassium 3.8 3.5 - 5.1 mmol/L   Chloride 104 98 - 111 mmol/L   CO2 23 22 - 32 mmol/L   Glucose, Bld 97 70 - 99 mg/dL   BUN 15 8 - 23 mg/dL   Creatinine, Ser 1.12 0.61 - 1.24 mg/dL   Calcium 9.7 8.9 - 10.3 mg/dL   Total Protein 8.4 (H) 6.5 - 8.1 g/dL   Albumin 4.6 3.5 - 5.0 g/dL   AST 23 15 - 41 U/L   ALT 16 0 - 44 U/L   Alkaline Phosphatase 67 38 - 126 U/L   Total Bilirubin 0.7 0.3 - 1.2 mg/dL   GFR calc non Af Amer >60 >60 mL/min   GFR calc Af Amer >60 >60 mL/min   Anion gap 11 5 - 15     Comment: Performed at [facility], [address], [place], [place] [address]  Urinalysis, Complete w Microscopic     Status: Abnormal   Collection Time: 08/22/19  1:53 PM  Result Value Ref Range   Color, Urine YELLOW (A) YELLOW   APPearance CLEAR (A) CLEAR   Specific Gravity, Urine 1.010 1.005 - 1.030   pH 6.0 5.0 - 8.0   Glucose, UA NEGATIVE NEGATIVE mg/dL   Hgb urine dipstick NEGATIVE NEGATIVE   Bilirubin Urine NEGATIVE NEGATIVE   Ketones, ur NEGATIVE NEGATIVE mg/dL   Protein, ur NEGATIVE NEGATIVE mg/dL   Nitrite NEGATIVE NEGATIVE   Leukocytes,Ua NEGATIVE NEGATIVE   WBC, UA 0-5 0 - 5 WBC/hpf   Bacteria, UA NONE SEEN NONE SEEN   Squamous Epithelial / LPF NONE SEEN 0 - 5    Comment: Performed at [facility], [address] [place], [place] [address]   Ct Head Wo Contrast  Result Date: 08/22/2019 CLINICAL DATA:  Weakness, intermittent speech difficulty, dementia EXAM: CT HEAD WITHOUT CONTRAST TECHNIQUE: Contiguous axial images were obtained from the base of the skull through the vertex without intravenous contrast. COMPARISON:  None. FINDINGS: Brain: No evidence of acute infarction, hemorrhage, hydrocephalus, extra-axial collection or mass lesion/mass effect. Subcortical white matter and periventricular small vessel ischemic changes. Vascular: No hyperdense vessel or unexpected calcification. Skull: Normal. Negative for fracture or focal lesion. Sinuses/Orbits: The visualized paranasal sinuses are essentially clear. The mastoid air cells are unopacified. Other: None. IMPRESSION: No evidence of acute intracranial abnormality. Small vessel ischemic changes. Electronically Signed   By: Julian Hy M.D.   On: 08/22/2019 14:15   Dg Chest Portable 1 View  Result Date: 08/22/2019 CLINICAL DATA:  AMS, evaluate for pneumonia EXAM: PORTABLE CHEST 1 VIEW COMPARISON:  03/28/2017 FINDINGS: The heart size and mediastinal contours are within normal limits. Emphysema. Probable  mild bibasilar fibrotic change. No acute appearing airspace opacity. The visualized skeletal structures are unremarkable. IMPRESSION: Emphysema. Probable mild bibasilar fibrotic change. No acute appearing airspace opacity. Electronically Signed   By: Kourosh Candle M.D.   On: 08/22/2019 14:24    Pending Labs Unresulted Labs (From admission, onward)    Start     Ordered   08/22/19 1444  SARS CORONAVIRUS 2 Nasal Swab Aptima Multi Swab  (Asymptomatic/Tier 2 Patients Labs)  ONCE -  STAT,   STAT    Question Answer Comment  Is this test for diagnosis or screening Screening   Symptomatic for COVID-19 as defined by CDC No   Hospitalized for COVID-19 No   Admitted to ICU for COVID-19 No   Previously tested for COVID-19 No   Resident in a congregate (group) care setting No   Employed in healthcare setting No      08/22/19 1443   Signed and Held  Hemoglobin A1c  Tomorrow morning,   R     Signed and Held   Signed and Held  Lipid panel  Tomorrow morning,   R    Comments: Fasting    Signed and Held   Signed and Held  CBC  Tomorrow morning,   R     Signed and Held   Signed and Held  Basic metabolic panel  Tomorrow morning,   R     Signed and Held          Vitals/Pain Today's Vitals   08/22/19 1400 08/22/19 1402 08/22/19 1456 08/22/19 1500  BP: (!) 147/81  (!) 159/99 (!) 157/103  Pulse:   93 94  Resp: (!) 31  16   Temp:  98.5 F (36.9 C)    TempSrc:  Rectal    SpO2:   98% 97%  Weight:      Height:      PainSc:        Isolation Precautions No active isolations  Medications Medications - No data to display  Mobility walks with person assist Moderate fall risk   Focused Assessments    R Recommendations: See Admitting Provider Note  Report given to:   Additional Notes:

## 2019-08-23 ENCOUNTER — Observation Stay: Payer: Medicare Other

## 2019-08-23 ENCOUNTER — Observation Stay: Admit: 2019-08-23 | Payer: Medicare Other

## 2019-08-23 ENCOUNTER — Inpatient Hospital Stay: Payer: Medicare Other

## 2019-08-23 DIAGNOSIS — Z79899 Other long term (current) drug therapy: Secondary | ICD-10-CM | POA: Diagnosis not present

## 2019-08-23 DIAGNOSIS — G8191 Hemiplegia, unspecified affecting right dominant side: Secondary | ICD-10-CM | POA: Diagnosis present

## 2019-08-23 DIAGNOSIS — I6381 Other cerebral infarction due to occlusion or stenosis of small artery: Secondary | ICD-10-CM | POA: Diagnosis present

## 2019-08-23 DIAGNOSIS — I10 Essential (primary) hypertension: Secondary | ICD-10-CM | POA: Diagnosis present

## 2019-08-23 DIAGNOSIS — Z87891 Personal history of nicotine dependence: Secondary | ICD-10-CM | POA: Diagnosis not present

## 2019-08-23 DIAGNOSIS — G459 Transient cerebral ischemic attack, unspecified: Secondary | ICD-10-CM | POA: Diagnosis present

## 2019-08-23 DIAGNOSIS — R4781 Slurred speech: Secondary | ICD-10-CM

## 2019-08-23 DIAGNOSIS — Z820 Family history of epilepsy and other diseases of the nervous system: Secondary | ICD-10-CM | POA: Diagnosis not present

## 2019-08-23 DIAGNOSIS — I639 Cerebral infarction, unspecified: Secondary | ICD-10-CM | POA: Diagnosis present

## 2019-08-23 DIAGNOSIS — Z20828 Contact with and (suspected) exposure to other viral communicable diseases: Secondary | ICD-10-CM | POA: Diagnosis present

## 2019-08-23 DIAGNOSIS — F4310 Post-traumatic stress disorder, unspecified: Secondary | ICD-10-CM | POA: Diagnosis present

## 2019-08-23 DIAGNOSIS — Z8249 Family history of ischemic heart disease and other diseases of the circulatory system: Secondary | ICD-10-CM | POA: Diagnosis not present

## 2019-08-23 DIAGNOSIS — Z8489 Family history of other specified conditions: Secondary | ICD-10-CM | POA: Diagnosis not present

## 2019-08-23 DIAGNOSIS — R29703 NIHSS score 3: Secondary | ICD-10-CM | POA: Diagnosis present

## 2019-08-23 DIAGNOSIS — R4587 Impulsiveness: Secondary | ICD-10-CM | POA: Diagnosis not present

## 2019-08-23 DIAGNOSIS — I452 Bifascicular block: Secondary | ICD-10-CM | POA: Diagnosis present

## 2019-08-23 DIAGNOSIS — E785 Hyperlipidemia, unspecified: Secondary | ICD-10-CM | POA: Diagnosis present

## 2019-08-23 LAB — BASIC METABOLIC PANEL
Anion gap: 12 (ref 5–15)
BUN: 16 mg/dL (ref 8–23)
CO2: 23 mmol/L (ref 22–32)
Calcium: 9.7 mg/dL (ref 8.9–10.3)
Chloride: 106 mmol/L (ref 98–111)
Creatinine, Ser: 1.21 mg/dL (ref 0.61–1.24)
GFR calc Af Amer: >60 mL/min (ref >60)
GFR calc non Af Amer: 59 mL/min — ABNORMAL LOW (ref >60)
Glucose, Bld: 101 mg/dL — ABNORMAL HIGH (ref 70–99)
Potassium: 3.6 mmol/L (ref 3.5–5.1)
Sodium: 141 mmol/L (ref 135–145)

## 2019-08-23 LAB — LIPID PANEL
Cholesterol: 258 mg/dL — ABNORMAL HIGH (ref 0–200)
HDL: 62 mg/dL (ref >40)
LDL Cholesterol: 179 mg/dL — ABNORMAL HIGH (ref 0–99)
Total CHOL/HDL Ratio: 4.2 RATIO
Triglycerides: 85 mg/dL (ref <150)
VLDL: 17 mg/dL (ref 0–40)

## 2019-08-23 LAB — CBC
HCT: 45.9 % (ref 39.0–52.0)
Hemoglobin: 14.5 g/dL (ref 13.0–17.0)
MCH: 22.1 pg — ABNORMAL LOW (ref 26.0–34.0)
MCHC: 31.6 g/dL (ref 30.0–36.0)
MCV: 69.9 fL — ABNORMAL LOW (ref 80.0–100.0)
Platelets: 272 10*3/uL (ref 150–400)
RBC: 6.57 MIL/uL — ABNORMAL HIGH (ref 4.22–5.81)
RDW: 18 % — ABNORMAL HIGH (ref 11.5–15.5)
WBC: 9.6 10*3/uL (ref 4.0–10.5)
nRBC: 0 % (ref 0.0–0.2)

## 2019-08-23 LAB — HEMOGLOBIN A1C
Hgb A1c MFr Bld: 5.8 % — ABNORMAL HIGH (ref 4.8–5.6)
Mean Plasma Glucose: 119.76 mg/dL

## 2019-08-23 LAB — SARS CORONAVIRUS 2 (TAT 6-24 HRS): SARS Coronavirus 2: NEGATIVE

## 2019-08-23 MED ORDER — CLOPIDOGREL BISULFATE 75 MG PO TABS
75.0000 mg | ORAL_TABLET | Freq: Every day | ORAL | Status: DC
  Administered 2019-08-23 – 2019-08-25 (×3): 75 mg via ORAL
  Filled 2019-08-23 (×3): qty 1

## 2019-08-23 MED ORDER — QUETIAPINE FUMARATE 25 MG PO TABS: 12.5000 mg | ORAL_TABLET | Freq: Every day | ORAL | Status: DC

## 2019-08-23 MED ORDER — MOMETASONE FURO-FORMOTEROL FUM 200-5 MCG/ACT IN AERO
2.0000 | INHALATION_SPRAY | Freq: Two times a day (BID) | RESPIRATORY_TRACT | Status: DC
  Administered 2019-08-23 – 2019-08-25 (×4): 2 via RESPIRATORY_TRACT
  Filled 2019-08-23: qty 8.8

## 2019-08-23 MED ORDER — OLANZAPINE 2.5 MG PO TABS
2.5000 mg | ORAL_TABLET | Freq: Every day | ORAL | Status: DC
  Administered 2019-08-24: 20:00:00 2.5 mg via ORAL
  Filled 2019-08-23 (×3): qty 1

## 2019-08-23 MED ORDER — LATANOPROST 0.005 % OP SOLN
1.0000 [drp] | Freq: Every day | OPHTHALMIC | Status: DC
  Administered 2019-08-24: 1 [drp] via OPHTHALMIC
  Filled 2019-08-23: qty 2.5

## 2019-08-23 MED ORDER — FLUTICASONE PROPIONATE 50 MCG/ACT NA SUSP
2.0000 | Freq: Every day | NASAL | Status: DC
  Administered 2019-08-23 – 2019-08-25 (×3): 2 via NASAL
  Filled 2019-08-23: qty 16

## 2019-08-23 MED ORDER — SERTRALINE HCL 50 MG PO TABS
75.0000 mg | ORAL_TABLET | Freq: Every day | ORAL | Status: DC
  Administered 2019-08-23 – 2019-08-25 (×3): 75 mg via ORAL
  Filled 2019-08-23 (×3): qty 2

## 2019-08-23 MED ORDER — ATORVASTATIN CALCIUM 20 MG PO TABS
80.0000 mg | ORAL_TABLET | Freq: Every day | ORAL | Status: DC
  Administered 2019-08-23 – 2019-08-25 (×3): 80 mg via ORAL
  Filled 2019-08-23 (×3): qty 4

## 2019-08-23 MED ORDER — VITAMIN B-6 50 MG PO TABS
100.0000 mg | ORAL_TABLET | Freq: Every day | ORAL | Status: DC
  Administered 2019-08-23 – 2019-08-25 (×3): 100 mg via ORAL
  Filled 2019-08-23 (×3): qty 2

## 2019-08-23 MED ORDER — VITAMIN B-12 100 MCG PO TABS
100.0000 ug | ORAL_TABLET | Freq: Every day | ORAL | Status: DC
  Administered 2019-08-23 – 2019-08-25 (×3): 100 ug via ORAL
  Filled 2019-08-23 (×3): qty 1

## 2019-08-23 MED ORDER — DORZOLAMIDE HCL 2 % OP SOLN
1.0000 [drp] | Freq: Three times a day (TID) | OPHTHALMIC | Status: DC
  Administered 2019-08-23 – 2019-08-25 (×6): 1 [drp] via OPHTHALMIC
  Filled 2019-08-23: qty 10

## 2019-08-23 MED ORDER — LORAZEPAM 2 MG/ML IJ SOLN
1.0000 mg | Freq: Four times a day (QID) | INTRAMUSCULAR | Status: DC | PRN
  Administered 2019-08-23: 18:00:00 1 mg via INTRAVENOUS
  Filled 2019-08-23: qty 1

## 2019-08-23 MED ORDER — LORAZEPAM 2 MG/ML IJ SOLN
1.0000 mg | Freq: Once | INTRAMUSCULAR | Status: AC
  Administered 2019-08-23: 1 mg via INTRAVENOUS
  Filled 2019-08-23: qty 1

## 2019-08-23 NOTE — Plan of Care (Signed)
  Problem: Education: Goal: Knowledge of disease or condition will improve Outcome: Not Progressing Goal: Knowledge of secondary prevention will improve Outcome: Not Progressing Goal: Knowledge of patient specific risk factors addressed and post discharge goals established will improve Outcome: Not Progressing   Problem: Coping: Goal: Will verbalize positive feelings about self Outcome: Not Progressing Goal: Will identify appropriate support needs Outcome: Not Progressing   Problem: Health Behavior/Discharge Planning: Goal: Ability to manage health-related needs will improve Outcome: Not Progressing   Problem: Self-Care: Goal: Ability to participate in self-care as condition permits will improve Outcome: Not Progressing Goal: Verbalization of feelings and concerns over difficulty with self-care will improve Outcome: Not Progressing Goal: Ability to communicate needs accurately will improve Outcome: Not Progressing   Problem: Ischemic Stroke/TIA Tissue Perfusion: Goal: Complications of ischemic stroke/TIA will be minimized Outcome: Not Progressing   Problem: Education: Goal: Knowledge of General Education information will improve Description: Including pain rating scale, medication(s)/side effects and non-pharmacologic comfort measures Outcome: Not Progressing   Problem: Health Behavior/Discharge Planning: Goal: Ability to manage health-related needs will improve Outcome: Not Progressing   Problem: Clinical Measurements: Goal: Ability to maintain clinical measurements within normal limits will improve Outcome: Not Progressing Goal: Will remain free from infection Outcome: Not Progressing Goal: Diagnostic test results will improve Outcome: Not Progressing Goal: Respiratory complications will improve Outcome: Not Progressing Goal: Cardiovascular complication will be avoided Outcome: Not Progressing   Problem: Activity: Goal: Risk for activity intolerance will  decrease Outcome: Not Progressing   Problem: Nutrition: Goal: Adequate nutrition will be maintained 08/23/2019 1855 by Oris Drone, RN Outcome: Not Progressing 08/23/2019 1454 by Oris Drone, RN Outcome: Not Progressing  Pt with ? Aspiration while using a staw; observed coughing; poor PO intake; started crushing medications and giving with applesauce Problem: Coping: Goal: Level of anxiety will decrease Outcome: Not Progressing   Problem: Elimination: Goal: Will not experience complications related to bowel motility Outcome: Not Progressing Goal: Will not experience complications related to urinary retention Outcome: Not Progressing   Problem: Pain Managment: Goal: General experience of comfort will improve Outcome: Not Progressing   Problem: Safety: Goal: Ability to remain free from injury will improve Outcome: Not Progressing   Problem: Skin Integrity: Goal: Risk for impaired skin integrity will decrease Outcome: Not Progressing

## 2019-08-23 NOTE — Care Management Obs Status (Signed)
[facility] NOTIFICATION   Patient Details  Name: Gevon Markus MRN: [id number] Date of Birth: [birth year]   Medicare Observation Status Notification Given:  Yes    Advika Mclelland A Semaya Vida, RN 08/23/2019, 8:38 AM

## 2019-08-23 NOTE — Progress Notes (Signed)
[facility] - [place] [facility]   PATIENT NAME: Jerome Wolfe    MR#:  [id number]  DATE OF BIRTH:  06-Apr-1946  SUBJECTIVE:  CHIEF COMPLAINT:   Chief Complaint  Patient presents with   Weakness   -Patient seen, wife at bedside this morning.  He appears to be very agitated and restless and decided to leave the hospital. -He is alert oriented but impulsivity noted.   REVIEW OF SYSTEMS:  Review of Systems  Constitutional: Positive for malaise/fatigue. Negative for chills and fever.  HENT: Negative for congestion, ear discharge, hearing loss and nosebleeds.   Eyes: Negative for blurred vision and double vision.  Respiratory: Negative for cough, shortness of breath and wheezing.   Cardiovascular: Negative for chest pain and palpitations.  Gastrointestinal: Negative for abdominal pain, constipation, diarrhea, nausea and vomiting.  Genitourinary: Negative for dysuria.  Musculoskeletal: Negative for myalgias.  Neurological: Positive for speech change and focal weakness. Negative for dizziness, seizures and headaches.  Psychiatric/Behavioral: Negative for depression.    DRUG ALLERGIES:  No Known Allergies  VITALS:  Blood pressure (!) 132/92, pulse 100, temperature 98.5 F (36.9 C), temperature source Oral, resp. rate 18, height 6\' 2"  (1.88 m), weight 80.7 kg, SpO2 98 %.  PHYSICAL EXAMINATION:  Physical Exam   GENERAL:  73 y.o.-year-old patient lying in the bed with no acute distress.  Very restless and impulsive. EYES: Pupils equal, round, reactive to light and accommodation. No scleral icterus. Extraocular muscles intact.  HEENT: Head atraumatic, normocephalic. Oropharynx and nasopharynx clear.  NECK:  Supple, no jugular venous distention. No thyroid enlargement, no tenderness.  LUNGS: Normal breath sounds bilaterally, no wheezing, rales,rhonchi or crepitation. No use of accessory muscles of respiration.  Decreased bibasilar breath sounds CARDIOVASCULAR:  S1, S2 normal. No  rubs, or gallops.  3/6 systolic murmur is present ABDOMEN: Soft, nontender, nondistended. Bowel sounds present. No organomegaly or mass.  EXTREMITIES: No pedal edema, cyanosis, or clubbing.  NEUROLOGIC: Cranial nerves II through XII are intact.  Right facial droop with drooling noted.  Muscle strength equal in all extremities. Sensation intact. Gait not checked.  PSYCHIATRIC: The patient is alert and oriented x 3.  Speech is slightly slurred SKIN: No obvious rash, lesion, or ulcer.    LABORATORY PANEL:   CBC Recent Labs  Lab 08/23/19 0542  WBC 9.6  HGB 14.5  HCT 45.9  PLT 272   ------------------------------------------------------------------------------------------------------------------  Chemistries  Recent Labs  Lab 08/22/19 1334 08/23/19 0542  NA 138 141  K 3.8 3.6  CL 104 106  CO2 23 23  GLUCOSE 97 101*  BUN 15 16  CREATININE 1.12 1.21  CALCIUM 9.7 9.7  AST 23  --   ALT 16  --   ALKPHOS 67  --   BILITOT 0.7  --    ------------------------------------------------------------------------------------------------------------------  Cardiac Enzymes No results for input(s): TROPONINI in the last 168 hours. ------------------------------------------------------------------------------------------------------------------  RADIOLOGY:  Ct Head Wo Contrast  Result Date: 08/22/2019 CLINICAL DATA:  Weakness, intermittent speech difficulty, dementia EXAM: CT HEAD WITHOUT CONTRAST TECHNIQUE: Contiguous axial images were obtained from the base of the skull through the vertex without intravenous contrast. COMPARISON:  None. FINDINGS: Brain: No evidence of acute infarction, hemorrhage, hydrocephalus, extra-axial collection or mass lesion/mass effect. Subcortical white matter and periventricular small vessel ischemic changes. Vascular: No hyperdense vessel or unexpected calcification. Skull: Normal. Negative for fracture or focal lesion. Sinuses/Orbits: The  visualized paranasal sinuses are essentially clear. The mastoid air cells are unopacified. Other: None. IMPRESSION:  No evidence of acute intracranial abnormality. Small vessel ischemic changes. Electronically Signed   By: Charline BillsSriyesh  Krishnan M.D.   On: 08/22/2019 14:15   Mr Brain Wo Contrast  Result Date: 08/22/2019 CLINICAL DATA:  Slurred speech and right-sided weakness starting this morning EXAM: MRI HEAD WITHOUT CONTRAST TECHNIQUE: Multiplanar, multiecho pulse sequences of the brain and surrounding structures were obtained without intravenous contrast. COMPARISON:  None. FINDINGS: Brain: 1 cm ovoid infarct in the posterior limb left internal capsule. Subcentimeter focus of weak diffusion restriction in the right frontal white matter and in the left deep white matter adjacent to the frontal horn of the left lateral ventricle. Background of extensive chronic small vessel ischemic type change the cerebral white matter where there is confluent FLAIR hyperintensity which even extends into the external capsules and temporal lobes. Minor small vessel ischemic type change in the pons. No acute hemorrhage, hydrocephalus, or masslike finding. Isointense nodular appearance along the frontal horn of the left lateral ventricle, favor heterotopic gray matter which would be incidental. Vascular: Major flow voids are preserved Skull and upper cervical spine: Negative for marrow lesion. Sinuses/Orbits: Negative for acute finding. Bilateral cataract resection IMPRESSION: 1. Acute lacunar infarct in the posterior limb left internal capsule. 2. Subacute appearing lacunar infarcts in the bifrontal white matter. 3. 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 Electronically Signed   By: Marnee SpringJonathon  Watts M.D.   On: 08/22/2019 19:13   [place] Carotid Bilateral (at [facility] And Ap Only)  Result Date: 08/23/2019 CLINICAL DATA:  TIA, hypertension and tobacco use. EXAM: BILATERAL CAROTID DUPLEX ULTRASOUND TECHNIQUE: Wallace CullensGray scale imaging,  color Doppler and duplex ultrasound were performed of bilateral carotid and vertebral arteries in the neck. COMPARISON:  None. FINDINGS: Criteria: Quantification of carotid stenosis is based on velocity parameters that correlate the residual internal carotid diameter with NASCET-based stenosis levels, using the diameter of the distal internal carotid lumen as the denominator for stenosis measurement. The following velocity measurements were obtained: RIGHT ICA:  82/30 cm/sec CCA:  65/17 cm/sec SYSTOLIC ICA/CCA RATIO:  1.2 ECA:  98 cm/sec LEFT ICA:  89/29 cm/sec CCA:  98/22 cm/sec SYSTOLIC ICA/CCA RATIO:  0.9 ECA:  134 cm/sec RIGHT CAROTID ARTERY: Mild to moderate partially calcified plaque at the level of the right carotid bulb. Mild amount of plaque present at the right ICA origin. Estimated right ICA stenosis is less than 50%. RIGHT VERTEBRAL ARTERY: Antegrade flow with normal waveform and velocity. LEFT CAROTID ARTERY: Mild amount of calcified plaque at the level of the left carotid bulb. Mild plaque at the left ICA origin. Estimated left ICA stenosis is less than 50%. LEFT VERTEBRAL ARTERY: Antegrade flow with normal waveform and velocity. IMPRESSION: Calcified plaque at the level of both carotid bulbs, right greater than left. Mild plaque at the level of both ICA origins. Estimated bilateral ICA stenoses are less than 50%. Electronically Signed   By: Irish LackGlenn  Yamagata M.D.   On: 08/23/2019 08:41   Dg Chest Portable 1 View  Result Date: 08/22/2019 CLINICAL DATA:  AMS, evaluate for pneumonia EXAM: PORTABLE CHEST 1 VIEW COMPARISON:  03/28/2017 FINDINGS: The heart size and mediastinal contours are within normal limits. Emphysema. Probable mild bibasilar fibrotic change. No acute appearing airspace opacity. The visualized skeletal structures are unremarkable. IMPRESSION: Emphysema. Probable mild bibasilar fibrotic change. No acute appearing airspace opacity. Electronically Signed   By: Lauralyn PrimesAlex  Bibbey M.D.   On:  08/22/2019 14:24    EKG:   Orders placed or performed during the hospital encounter of  08/22/19   ED EKG   ED EKG   EKG 12-Lead   EKG 12-Lead    ASSESSMENT AND PLAN:   73 year old male with past medical history significant for hypertension, mild cognitive decline presents to hospital secondary to right-sided weakness and slurred speech.  1.  Acute stroke-patient presenting with right facial droop and mild right-sided weakness. -MRI of the brain showing small infarct in the posterior limb of left internal capsule. -Carotid Dopplers showing plaque buildup but less than 50% stenosis. -Echocardiogram is pending -Started on aspirin and Plavix, continue dual antiplatelet agent therapy for 3 weeks followed by aspirin alone. -Increase his dose of statin as LDL greater than 170 -PT/OT and speech therapy consults  2.  Hypertension-  3.  Hyperlipidemia-LDL of 179, with acute stroke, changed to high intensity statin at 80 mg  4.  DVT prophylaxis-Lovenox  5.  Mild delirium in the hospital-try Zyprexa as needed  PT and OT consults requested.  Wife updated at bedside.    All the records are reviewed and case discussed with Care Management/Social Workerr. Management plans discussed with the patient, family and they are in agreement.  CODE STATUS: Full code  TOTAL TIME TAKING CARE OF THIS PATIENT: 38 minutes.   POSSIBLE D/C IN 1-2 DAYS, DEPENDING ON CLINICAL CONDITION.   Gladstone Lighter M.D on 08/23/2019 at 12:15 PM  Between 7am to 6pm - Pager - [phone]  After 6pm go to [email or web address] - password [email or web address] [facility]  Office  [phone]  CC: Primary care physician; Hoover Brunette, MD

## 2019-08-23 NOTE — Consult Note (Addendum)
Reason for Consult: R sided weakness  Referring Physician: Dr. Tressia Miners   CC: R sided weakness   HPI: Jerome Wolfe is an 73 y.o. male with a known history of hypertension who presented to the ED with slurred speech and right sided weakness that started around 8 AM yesterday Per wife, patient began having slurred speech when they were drinking coffee this morning.  He then went to go comb his hair, but could not lift his right arm up to his head.   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    Past Medical History:  Diagnosis Date  . Hypertension     Past Surgical History:  Procedure Laterality Date  . NO PAST SURGERIES      Family History  Problem Relation Age of Onset  . Dementia Mother   . Heart attack Father 30    Social History:  reports that he quit smoking about 9 years ago. He has never used smokeless tobacco. He reports that he does not drink alcohol or use drugs.  No Known Allergies  Medications: I have reviewed the patient's current medications.  ROS: Unable to obtain due to confusion   Physical Examination: Blood pressure (!) 132/92, pulse 100, temperature 98.5 F (36.9 C), temperature source Oral, resp. rate 18, height 6\' 2"  (1.88 m), weight 80.7 kg, SpO2 98 %.   Neurological Examination   Mental Status: Confused. Able to state name.  Cranial Nerves: II: Discs flat bilaterally III,IV, VI: ptosis not present, extra-ocular motions intact bilaterally V,VII: R facial droop VIII: hearing normal bilaterally IX,X: gag reflex present XI: bilateral shoulder shrug XII: midline tongue extension Motor: Generalized weakness with RUE 4/5 and RLE is 3/5.  Tone and bulk:normal tone throughout; no atrophy noted Sensory: Pinprick and light touch intact throughout, bilaterally Deep Tendon Reflexes: 1+ and symmetric throughout Plantars: Right: downgoing   Left: downgoing Cerebellar: normal finger-to-nose Gait: not tested      Laboratory Studies:   Basic Metabolic  Panel: Recent Labs  Lab 08/22/19 1334 08/23/19 0542  NA 138 141  K 3.8 3.6  CL 104 106  CO2 23 23  GLUCOSE 97 101*  BUN 15 16  CREATININE 1.12 1.21  CALCIUM 9.7 9.7    Liver Function Tests: Recent Labs  Lab 08/22/19 1334  AST 23  ALT 16  ALKPHOS 67  BILITOT 0.7  PROT 8.4*  ALBUMIN 4.6   No results for input(s): LIPASE, AMYLASE in the last 168 hours. No results for input(s): AMMONIA in the last 168 hours.  CBC: Recent Labs  Lab 08/22/19 1334 08/23/19 0542  WBC 8.6 9.6  NEUTROABS 5.2  --   HGB 14.5 14.5  HCT 46.2 45.9  MCV 69.2* 69.9*  PLT 267 272    Cardiac Enzymes: No results for input(s): CKTOTAL, CKMB, CKMBINDEX, TROPONINI in the last 168 hours.  BNP: Invalid input(s): POCBNP  CBG: No results for input(s): GLUCAP in the last 168 hours.  Microbiology: No results found for this or any previous visit.  Coagulation Studies: No results for input(s): LABPROT, INR in the last 72 hours.  Urinalysis:  Recent Labs  Lab 08/22/19 1353  COLORURINE YELLOW*  LABSPEC 1.010  PHURINE 6.0  GLUCOSEU NEGATIVE  HGBUR NEGATIVE  BILIRUBINUR NEGATIVE  KETONESUR NEGATIVE  PROTEINUR NEGATIVE  NITRITE NEGATIVE  LEUKOCYTESUR NEGATIVE    Lipid Panel:     Component Value Date/Time   CHOL 258 (H) 08/23/2019 0542   TRIG 85 08/23/2019 0542   HDL 62 08/23/2019 0542  CHOLHDL 4.2 08/23/2019 0542   VLDL 17 08/23/2019 0542   LDLCALC 179 (H) 08/23/2019 0542    HgbA1C: No results found for: HGBA1C  Urine Drug Screen:  No results found for: LABOPIA, COCAINSCRNUR, LABBENZ, AMPHETMU, THCU, LABBARB  Alcohol Level: No results for input(s): ETH in the last 168 hours.  Other results: EKG: normal EKG, normal sinus rhythm, unchanged from previous tracings.  Imaging: Ct Head Wo Contrast  Result Date: 08/22/2019 CLINICAL DATA:  Weakness, intermittent speech difficulty, dementia EXAM: CT HEAD WITHOUT CONTRAST TECHNIQUE: Contiguous axial images were obtained from the base  of the skull through the vertex without intravenous contrast. COMPARISON:  None. FINDINGS: Brain: No evidence of acute infarction, hemorrhage, hydrocephalus, extra-axial collection or mass lesion/mass effect. Subcortical white matter and periventricular small vessel ischemic changes. Vascular: No hyperdense vessel or unexpected calcification. Skull: Normal. Negative for fracture or focal lesion. Sinuses/Orbits: The visualized paranasal sinuses are essentially clear. The mastoid air cells are unopacified. Other: None. IMPRESSION: No evidence of acute intracranial abnormality. Small vessel ischemic changes. Electronically Signed   By: Charline BillsSriyesh  Krishnan M.D.   On: 08/22/2019 14:15   Mr Brain Wo Contrast  Result Date: 08/22/2019 CLINICAL DATA:  Slurred speech and right-sided weakness starting this morning EXAM: MRI HEAD WITHOUT CONTRAST TECHNIQUE: Multiplanar, multiecho pulse sequences of the brain and surrounding structures were obtained without intravenous contrast. COMPARISON:  None. FINDINGS: Brain: 1 cm ovoid infarct in the posterior limb left internal capsule. Subcentimeter focus of weak diffusion restriction in the right frontal white matter and in the left deep white matter adjacent to the frontal horn of the left lateral ventricle. Background of extensive chronic small vessel ischemic type change the cerebral white matter where there is confluent FLAIR hyperintensity which even extends into the external capsules and temporal lobes. Minor small vessel ischemic type change in the pons. No acute hemorrhage, hydrocephalus, or masslike finding. Isointense nodular appearance along the frontal horn of the left lateral ventricle, favor heterotopic gray matter which would be incidental. Vascular: Major flow voids are preserved Skull and upper cervical spine: Negative for marrow lesion. Sinuses/Orbits: Negative for acute finding. Bilateral cataract resection IMPRESSION: 1. Acute lacunar infarct in the posterior limb  left internal capsule. 2. Subacute appearing lacunar infarcts in the bifrontal white matter. 3. 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 Electronically Signed   By: Marnee SpringJonathon  Watts M.D.   On: 08/22/2019 19:13   [place] Carotid Bilateral (at [facility] And Ap Only)  Result Date: 08/23/2019 CLINICAL DATA:  TIA, hypertension and tobacco use. EXAM: BILATERAL CAROTID DUPLEX ULTRASOUND TECHNIQUE: Wallace CullensGray scale imaging, color Doppler and duplex ultrasound were performed of bilateral carotid and vertebral arteries in the neck. COMPARISON:  None. FINDINGS: Criteria: Quantification of carotid stenosis is based on velocity parameters that correlate the residual internal carotid diameter with NASCET-based stenosis levels, using the diameter of the distal internal carotid lumen as the denominator for stenosis measurement. The following velocity measurements were obtained: RIGHT ICA:  82/30 cm/sec CCA:  65/17 cm/sec SYSTOLIC ICA/CCA RATIO:  1.2 ECA:  98 cm/sec LEFT ICA:  89/29 cm/sec CCA:  98/22 cm/sec SYSTOLIC ICA/CCA RATIO:  0.9 ECA:  134 cm/sec RIGHT CAROTID ARTERY: Mild to moderate partially calcified plaque at the level of the right carotid bulb. Mild amount of plaque present at the right ICA origin. Estimated right ICA stenosis is less than 50%. RIGHT VERTEBRAL ARTERY: Antegrade flow with normal waveform and velocity. LEFT CAROTID ARTERY: Mild amount of calcified plaque at the level of the left  carotid bulb. Mild plaque at the left ICA origin. Estimated left ICA stenosis is less than 50%. LEFT VERTEBRAL ARTERY: Antegrade flow with normal waveform and velocity. IMPRESSION: Calcified plaque at the level of both carotid bulbs, right greater than left. Mild plaque at the level of both ICA origins. Estimated bilateral ICA stenoses are less than 50%. Electronically Signed   By: Irish LackGlenn  Yamagata M.D.   On: 08/23/2019 08:41   Dg Chest Portable 1 View  Result Date: 08/22/2019 CLINICAL DATA:  AMS, evaluate for pneumonia EXAM:  PORTABLE CHEST 1 VIEW COMPARISON:  03/28/2017 FINDINGS: The heart size and mediastinal contours are within normal limits. Emphysema. Probable mild bibasilar fibrotic change. No acute appearing airspace opacity. The visualized skeletal structures are unremarkable. IMPRESSION: Emphysema. Probable mild bibasilar fibrotic change. No acute appearing airspace opacity. Electronically Signed   By: Lauralyn PrimesAlex  Bibbey M.D.   On: 08/22/2019 14:24     Assessment/Plan:  73 y.o. male with a known history of hypertension who presented to the ED with slurred speech and right sided weakness that started around 8 AM yesterday Per wife, patient began having slurred speech when they were drinking coffee this morning.  He then went to go comb his hair, but could not lift his right arm up to his head.   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    - Started on ASA and was not on it prior  - will repeat CTH as family and nursing staff states he is weaker on R side to make sure no extension of stroke/hermorrhage.  - speech slightly improved.   - pt/ot - speech therapy -2decho 08/23/2019, 12:23 PM

## 2019-08-23 NOTE — Progress Notes (Signed)
PT Cancellation Note  Patient Details Name: Jerome Wolfe MRN: [id number] DOB: 1946/01/08   Cancelled Treatment:    Reason Eval/Treat Not Completed: Patient at procedure or test/unavailable(2nd attempt to see pt this date. Pt enroute to radiology. RN/wife report worsening hemiplegia, delerium. Will attempt eval again at later date/time.)  3:33 PM, 08/23/19 Etta Grandchild, PT, DPT Physical Therapist - [facility]  [phone] ([place])   [place] C 08/23/2019, 3:33 PM

## 2019-08-23 NOTE — Plan of Care (Signed)
  Problem: Education: Goal: Knowledge of disease or condition will improve Outcome: Not Progressing Goal: Knowledge of secondary prevention will improve Outcome: Not Progressing Goal: Knowledge of patient specific risk factors addressed and post discharge goals established will improve Outcome: Not Progressing   Problem: Coping: Goal: Will verbalize positive feelings about self Outcome: Not Progressing Goal: Will identify appropriate support needs Outcome: Not Progressing   Problem: Health Behavior/Discharge Planning: Goal: Ability to manage health-related needs will improve Outcome: Not Progressing   Problem: Self-Care: Goal: Ability to participate in self-care as condition permits will improve Outcome: Not Progressing Goal: Verbalization of feelings and concerns over difficulty with self-care will improve Outcome: Not Progressing Goal: Ability to communicate needs accurately will improve Outcome: Not Progressing   Problem: Ischemic Stroke/TIA Tissue Perfusion: Goal: Complications of ischemic stroke/TIA will be minimized Outcome: Not Progressing   Problem: Education: Goal: Knowledge of General Education information will improve Description: Including pain rating scale, medication(s)/side effects and non-pharmacologic comfort measures Outcome: Not Progressing   Problem: Health Behavior/Discharge Planning: Goal: Ability to manage health-related needs will improve Outcome: Not Progressing   Problem: Clinical Measurements: Goal: Ability to maintain clinical measurements within normal limits will improve Outcome: Not Progressing Goal: Will remain free from infection Outcome: Not Progressing Goal: Diagnostic test results will improve Outcome: Not Progressing Goal: Respiratory complications will improve Outcome: Not Progressing Goal: Cardiovascular complication will be avoided Outcome: Not Progressing   Problem: Activity: Goal: Risk for activity intolerance will  decrease Outcome: Not Progressing   Problem: Nutrition: Goal: Adequate nutrition will be maintained Outcome: Not Progressing   Problem: Coping: Goal: Level of anxiety will decrease Outcome: Not Progressing   Problem: Elimination: Goal: Will not experience complications related to bowel motility Outcome: Not Progressing Goal: Will not experience complications related to urinary retention Outcome: Not Progressing   Problem: Pain Managment: Goal: General experience of comfort will improve Outcome: Not Progressing   Problem: Safety: Goal: Ability to remain free from injury will improve Outcome: Not Progressing   Problem: Skin Integrity: Goal: Risk for impaired skin integrity will decrease Outcome: Not Progressing
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
## 2019-08-24 ENCOUNTER — Inpatient Hospital Stay (HOSPITAL_COMMUNITY)
Admit: 2019-08-24 | Discharge: 2019-08-24 | Disposition: A | Discharge status: Home / Self Care | Payer: Medicare Other | Attending: Internal Medicine | Admitting: Internal Medicine

## 2019-08-24 DIAGNOSIS — G459 Transient cerebral ischemic attack, unspecified: Secondary | ICD-10-CM

## 2019-08-24 LAB — ECHOCARDIOGRAM COMPLETE
Height: 74 in
Weight: 2848 oz

## 2019-08-24 MED ORDER — AMLODIPINE BESYLATE 5 MG PO TABS: 5.0000 mg | ORAL_TABLET | Freq: Every day | ORAL | 2 refills | Status: DC

## 2019-08-24 MED ORDER — ASPIRIN 81 MG PO TBEC: 81.0000 mg | DELAYED_RELEASE_TABLET | Freq: Every day | ORAL | 2 refills | Status: DC

## 2019-08-24 MED ORDER — ATORVASTATIN CALCIUM 80 MG PO TABS: 80.0000 mg | ORAL_TABLET | Freq: Every day | ORAL | 2 refills | Status: DC

## 2019-08-24 MED ORDER — CLOPIDOGREL BISULFATE 75 MG PO TABS: 75.0000 mg | ORAL_TABLET | Freq: Every day | ORAL | 0 refills | Status: DC

## 2019-08-24 NOTE — Progress Notes (Signed)
Neurology Follow-up Progress Note [place] at [facility]  Date of Admission: 08/22/2019   ASSESSMENT:  Jerome Wolfe is a 73 y.o. year old male with past medical history significant for HTN who presented on 08/22/2019 with slurred speech and right hemibody weakness that started the day before. MRI Brain w/o demonstrated an acute lacunar infarct in the posterior limb left internal capsule and subacute appearing lacunar infarcts in the bifrontal white Matter. No afib on telemetry thus far. Carotid ultrasound demonstrated no significant carotid stenosis. TTE pending. LDL 179. HDL 62. A1c 5.8.   Yesterday, he had an episode where he felt his right leg was weaker so a repeat HCT w/o was done and it demonstrated no new infarcts.  This morning, patient is doing well. He denied having any weakness in his right leg yesterday. He feels that his weakness has improved. He states that he would like to walk around and is tired of being in bed. On exam today, he is full strength throughout. Slight right facial weakness. Sensation intact.   PLAN: - Continue ASA 81 mg and Plavix 75 mg daily and Lipitor 80 mg daily  - TTE pending. Will follow-up - Recommend PT/OT  - Patient will need neurology follow-up upon discharge. Patient should be placed on a heart monitor (during follow-up) to look for underlying afib  - Please call with any questions or concerns.    SUBJECTIVE:  No acute events overnight. Patient is doing well and states that he wants to go home.    OBJECTIVE: Vital Signs Temp:  [97.9 F (36.6 C)-98.5 F (36.9 C)] 97.9 F (36.6 C) (08/24 0449) Pulse Rate:  [90-113] 90 (08/24 0449) Resp:  [18-19] 19 (08/24 0449) BP: (107-132)/(69-103) 129/85 (08/24 0449) SpO2:  [94 %-98 %] 98 % (08/24 0449)   Physical Exam:  GENERAL:  No acute distress.  SKIN:   Inspection: well perfused, no edema.   MENTAL STATUS EXAM:    Orientation: Alert and oriented to person, place and time (month). He said  "1920" and when he was corrected he said he meant "2020"  Memory: Cooperative, follows commands well. Attention, concentration: Attention span and concentration are normal.  Language: Speech is dysarthric and language is normal.  Fund of knowledge: Aware of current events, vocabulary appropriate for patient age.   CRANIAL NERVES:    CN 2 (Optic): Visual fields intact to confrontation  CN 3,4,6 (EOM): Pupils equal and reactive to light. Full extraocular eye movement without nystagmus.  CN 5 (Trigeminal): Facial sensation is normal, no weakness of masticatory muscles.  CN 7 (Facial): Slight right lower facial weakness but activates well  CN 8 (Auditory): Auditory acuity grossly normal.  CN 9,10 (Glossophar): The uvula is midline, the palate elevates symmetrically.  CN 11 (spinal access): Normal sternocleidomastoid and trapezius strength.  CN 12 (Hypoglossal): The tongue is midline. No atrophy or fasciculations.Marland Kitchen   MOTOR:  Muscle Strength: Strength - 5/5 and symmetric in the upper and lower extremities, no pronation or drift. Muscle Tone: Tone and muscle bulk are normal in the upper and lower extremities.   SENSATION:   Intact to light touch   GAIT: Deferred  Labs: I've reviewed the labs for today   Diagnostic Results: - MRI Brain w/o: 1. Acute lacunar infarct in the posterior limb left internal capsule. 2. Subacute appearing lacunar infarcts in the bifrontal white matter. 3. 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 - TTE: pending - CUS: no significant carotid stenosis

## 2019-08-24 NOTE — Discharge Instructions (Signed)
1. No driving until evaluated by Neurology as outpatient.

## 2019-08-24 NOTE — TOC Initial Note (Signed)
Transition of Care [facility](TOC) - Initial/Assessment Note    Patient Details  Name: Jerome Wolfe MRN: [id number] Date of Birth: 03-08-1946  Transition of Care [facility](TOC) CM/SW Contact:    Jerome ButcherJeanna M Teon Hudnall, RN Phone Number: 08/24/2019, 4:06 PM  Clinical Narrative:                 Patient admitted for a stroke, wife is at the bedside.  Patient is from home with his wife and is independent at baseline.  Patient wants to go home and does not want to go to SNF or inpatient rehab.  Initially patient's wife, Jerome Wolfe wanted to take patient home also with home health but after watching patient get up from the chair and the bed and seeing the amount of assistance he requires she realizes that she cannot take care of him by herself.  PT and OT have recommended SNF.  SNF is short term just to get patient strong enough to be able to go home with home health.  Currently the patient has no equipment at home.  Wife requests that SNF be close to home, bed search has been started - [place] in [place].     Expected Discharge Plan: Skilled Nursing Facility Barriers to Discharge: SNF Pending bed offer   Patient Goals and CMS Choice Patient states their goals for this hospitalization and ongoing recovery are:: Patient wants to go home today CMS Medicare.gov Compare Post Acute Care list provided to:: Patient Represenative (must comment) Choice offered to / list presented to : Spouse  Expected Discharge Plan and Services Expected Discharge Plan: Skilled Nursing Facility     Post Acute Care Choice: Skilled Nursing Facility Living arrangements for the past 2 months: Single Family Home Expected Discharge Date: 08/24/19                                    Prior Living Arrangements/Services Living arrangements for the past 2 months: Single Family Home Lives with:: Spouse Patient language and need for interpreter reviewed:: No Do you feel safe going back to the place where you live?: Yes      Need for Family  Participation in Patient Care: Yes (Comment)(pt has had a stroke) Care giver support system in place?: Yes (comment)   Criminal Activity/Legal Involvement Pertinent to Current Situation/Hospitalization: No - Comment as needed  Activities of Daily Living Home Assistive Devices/Equipment: None ADL Screening (condition at time of admission) Patient's cognitive ability adequate to safely complete daily activities?: Yes Is the patient deaf or have difficulty hearing?: No Does the patient have difficulty seeing, even when wearing glasses/contacts?: No Does the patient have difficulty concentrating, remembering, or making decisions?: No Patient able to express need for assistance with ADLs?: Yes Does the patient have difficulty dressing or bathing?: No Independently performs ADLs?: Yes (appropriate for developmental age) Does the patient have difficulty walking or climbing stairs?: Yes Weakness of Legs: Both Weakness of Arms/Hands: None  Permission Sought/Granted Permission sought to share information with : Case Manager, Family Supports, Oceanographeracility Contact Representative Permission granted to share information with : Yes, Verbal Permission Granted     Permission granted to share info w AGENCY: SNF  Permission granted to share info w Relationship: Wife     Emotional Assessment Appearance:: Appears stated age Attitude/Demeanor/Rapport: Engaged Affect (typically observed): Accepting Orientation: : Oriented to Self, Oriented to Place, Oriented to  Time, Oriented to Situation Alcohol / Substance Use: Not  Applicable Psych Involvement: No (comment)  Admission diagnosis:  TIA (transient ischemic attack) [G45.9] Patient Active Problem List   Diagnosis Date Noted  . Stroke (cerebrum) ([place]) 08/23/2019  . Slurred speech 08/22/2019   PCP:  Hoover Brunette, MD Pharmacy:   [facility] [place], [address]  [place] [address] Phone: [phone] Fax: [place], [place] - [facility] [address] [place] [address] [facility] [place] [address] Phone: [phone] Fax: [phone]     Social Determinants of Health (SDOH) Interventions    Readmission Risk Interventions No flowsheet data found.

## 2019-08-24 NOTE — Evaluation (Signed)
Occupational Therapy Evaluation Patient Details Name: Jerome Wolfe MRN: [id number] DOB: 06-Feb-1946 Today's Date: 08/24/2019    History of Present Illness Jerome Wolfe is an 73 y.o. male with a known history of mild cognitive decline (dementia per family) and HTN who presented to the ED with slurred speech and right sided weakness. MRI of the brain showing small infarct in the posterior limb of left internal capsule.   Clinical Impression   Pt seen for OT evaluation this date. Prior to hospital admission, pt was living with his spouse in a 1 story home with 3 steps and bilat rails. 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 Pt does endorse occasionally using furniture to stabilize himself and reports BUE tremors that are not new but worse now than compared to baseline. Pt very soft spoken, breathy, often mumbled and pt reports difficulty at times with getting his words out. Pt demonstrates generalized weakness, no focal weakness or sensory deficits appreciated, impaired FMC bilaterally with BUE tremors worsening with intentional movements but present at rest as well, significant posterior lean when standing but able to improve with cues and instruction in RW mgt. Decreased ankle ROM which also impaired pt's balance. Pt impairments now lead to the need for increased assist for ADL (CGA to Min A) and functional mobility tasks (CGA to Min A) (see below for additional detail). Functional transfer training for ADL and static/dynamic standing balance activities with noted improvement in performance as session progressed. Pt would benefit from skilled OT to address noted impairments and functional limitations (see below for any additional details) in order to maximize safety and independence while minimizing falls risk and caregiver burden.  Upon hospital discharge, recommend pt discharge to SNF with supervision for all functional mobility and standing ADL tasks.     Follow Up Recommendations  SNF    Equipment Recommendations  3 in 1 bedside commode    Recommendations for Other Services       Precautions / Restrictions Precautions Precautions: Fall Restrictions Weight Bearing Restrictions: No      Mobility Bed Mobility   General bed mobility comments: deferred, up in recliner  Transfers Overall transfer level: Needs assistance Equipment used: Rolling walker (2 wheeled) Transfers: Sit to/from Stand Sit to Stand: Min assist         General transfer comment: VC to scoot towards edge of chair, hand placement, and stability upon initially standing with significant posterior lean requiring assist to correct    Balance Overall balance assessment: Needs assistance Sitting-balance support: Single extremity supported Sitting balance-Leahy Scale: Fair   Postural control: Posterior lean Standing balance support: Bilateral upper extremity supported Standing balance-Leahy Scale: Poor Standing balance comment: initial mod assist for standing balance, with cues for anterior weight shift and RW mgt, pt able to improve static standing balance to CGA to Min A                           ADL either performed or assessed with clinical judgement   ADL                                         General ADL Comments: CGA to Min A to complete LB dressing when transitioning to standing to complete over hips 2/2 decr balance; difficulty with Jerome Wolfe tasks 2/2 BUE tremors, CGA  to Min A for functional ADL transfers     Vision Baseline Vision/History: Wears glasses Wears Glasses: Reading only Patient Visual Report: No change from baseline Vision Assessment?: No apparent visual deficits     Perception     Praxis      Pertinent Vitals/Pain Pain Assessment: No/denies pain Faces Pain Scale: No hurt Pain Intervention(s): Monitored during session     Hand Dominance Right   Extremity/Trunk Assessment Upper Extremity  Assessment Upper Extremity Assessment: RUE deficits/detail;LUE deficits/detail RUE Deficits / Details: At least 4/5 and strong hand grip; impaired coordination with thumb opposition and finger to nose testing, denies sensory deficits, tremors noted RUE Sensation: WNL RUE Coordination: decreased Wolfe motor LUE Deficits / Details: At least 4/5 and strong hand grip; impaired coordination with thumb opposition and finger to nose testing, denies sensory deficits, tremors noted (worse on L than R) LUE Sensation: WNL LUE Coordination: decreased Wolfe motor   Lower Extremity Assessment Lower Extremity Assessment: RLE deficits/detail;LLE deficits/detail RLE Deficits / Details: at least 4/5, decreased DF/PF, pt denies sensory deficits RLE Sensation: WNL RLE Coordination: decreased Wolfe motor LLE Deficits / Details: at least 4/5, decreased DF/PF, pt denies sensory deficits LLE Sensation: WNL LLE Coordination: decreased Wolfe motor   Cervical / Trunk Assessment Cervical / Trunk Assessment: Normal   Communication Communication Communication: Expressive difficulties(very soft spoken, pt endorses some mild difficulty with getting his words out, mumbles)   Cognition Arousal/Alertness: Awake/alert Behavior During Therapy: Flat affect Overall Cognitive Status: History of cognitive impairments - at baseline                                 General Comments: Per chart, mild cognitive decline at baseline; alert and oriented, follows simple commands,   General Comments       Exercises Other Exercises Other Exercises: functional transfer training and static/dynamic balance training for ADL tasks   Shoulder Instructions      Home Living Family/patient expects to be discharged to:: Private residence Living Arrangements: Spouse/significant other Available Help at Discharge: Family Type of Home: House Home Access: Stairs to enter Technical brewer of Steps: 3 Entrance Stairs-Rails:  Can reach both Home Layout: One level     Bathroom Shower/Tub: Occupational psychologist: Standard Bathroom Accessibility: Yes   Home Equipment: Shower seat - built in;Grab bars - tub/shower;Hand held shower head          Prior Functioning/Environment Level of Independence: Independent        Comments: Pt reports being independent with mobility (although sometimes "furniture walking"), ADL, and 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        OT Problem List: Decreased strength;Decreased range of motion;Decreased coordination;Impaired balance (sitting and/or standing);Decreased activity tolerance      OT Treatment/Interventions: Self-care/ADL training;Therapeutic exercise;Therapeutic activities;Neuromuscular education;Cognitive remediation/compensation;Patient/family education;DME and/or AE instruction;Balance training    OT Goals(Current goals can be found in the care plan section) Acute Rehab OT Goals Patient Stated Goal: go home to wife and get back to normal routines OT Goal Formulation: With patient Time For Goal Achievement: 09/07/19 Potential to Achieve Goals: Good ADL Goals Pt Will Perform Lower Body Dressing: with min guard assist;sit to/from stand Pt Will Transfer to Toilet: with supervision;ambulating(elevated commode, LRAD for amb, no LOB)  OT Frequency: Min 2X/week   Barriers to D/C:            Co-evaluation  AM-PAC OT "6 Clicks" Daily Activity     Outcome Measure Help from another person eating meals?: A Little Help from another person taking care of personal grooming?: A Little Help from another person toileting, which includes using toliet, bedpan, or urinal?: A Little Help from another person bathing (including washing, rinsing, drying)?: A Little Help from another person to put on and taking off regular upper body clothing?: A Little Help from another person to put on and taking off regular lower body clothing?: A Little 6  Click Score: 18   End of Session Equipment Utilized During Treatment: Rolling walker;Gait belt  Activity Tolerance: Patient tolerated treatment well Patient left: in chair;with call bell/phone within reach;with chair alarm set  OT Visit Diagnosis: Other abnormalities of gait and mobility (R26.89);Muscle weakness (generalized) (M62.81)                Time: [id number] OT Time Calculation (min): 35 min Charges:  OT General Charges $OT Visit: 1 Visit OT Evaluation $OT Eval Moderate Complexity: 1 Mod OT Treatments $Therapeutic Activity: 8-22 mins $Neuromuscular Re-education: 8-22 mins  Richrd PrimeJamie Stiller, MPH, MS, OTR/L ascom [phone] 08/24/19, 1:00 PM

## 2019-08-24 NOTE — Evaluation (Addendum)
Speech Language Pathology Evaluation Patient Details Name: Jerome Wolfe MRN: [id number] DOB: [birth year] Today's Date: 08/24/2019 Time: 5726-2035 SLP Time Calculation (min) (ACUTE ONLY): 51 min  Problem List:  Patient Active Problem List   Diagnosis Date Noted  . Stroke (cerebrum) ([place]) 08/23/2019  . Slurred speech 08/22/2019   Past Medical History:  Past Medical History:  Diagnosis Date  . Hypertension    Past Surgical History:  Past Surgical History:  Procedure Laterality Date  . NO PAST SURGERIES     HPI:   Pt is a is an 73 y.o. male with a known history of mild cognitive decline (dementia per family) and HTN who presented to the ED with slurred speech, and right sided weakness. MRI of the brain showing new small infarct in the posterior limb of left internal capsule; Baseline deficits.  Assessment / Plan / Recommendation Clinical Impression  Pt appears to present w/ Cognitive-linguistic deficits impacting his receptive/expressive language abilities during more complex tasks. He also exhibits Dysfluency of speech, and Dysarthria. These deficits impact his communication abilities during ADLs as well. Unsure of pt's Baseline Cognitive-linguistic functioning as no family was present during this evaluation. Pt is able to utilize strategies of Slow Rate, Increase Volume, and Over-articulate to increase speech intelligibility during communication w/ others. Use of these strategies also decreases his Dysfluency. Pt has to be reminded to use his strategies when his speech volume is low and speech is more mumbled. During the OM exam, noted R lingual/labial weakness; reduced lingual strength on R side. W/ effort, pt was able to complete OM tasks. Noted increased saliva not consistently managed and cleared - min decreased awareness? Pt required verbal cues and guidance w/ language tasks for full follow through and/or to elaborate and give detail to information. Unsure of what pt's Baseline status is  - per chart noted of Family report, he has Baseline Cognitive impairment(per PT/OT notes).  Pt would benefit from further skilled ST services in next setting to more formally assess pt's Cognitive-linguistic status, any changes from his Baseline of decline/Dementia, and to address new R OM weakness; Dysarthria, Dysfluency of speech. Pt and family would benefit from education on supportive strategies; initial handouts given for d/c. NSG and CM, MD updated. (Pt seemed quite pleased on using the strategies.)     SLP Assessment  SLP Recommendation/Assessment: All further Speech Lanaguage Pathology  needs can be addressed in the next venue of care SLP Visit Diagnosis: Dysarthria and anarthria (R47.1);Cognitive communication deficit (R41.841)(Dysfluency)    Follow Up Recommendations  Home health SLP    Frequency and Duration (next venue)  (next venue)      SLP Evaluation Cognition  Overall Cognitive Status: History of cognitive impairments - at baseline(per report of other discipline(OT)) Arousal/Alertness: Awake/alert Orientation Level: Oriented to person;Oriented to place;Oriented to time;Disoriented to situation Attention: Focused;Sustained Focused Attention: Appears intact Sustained Attention: Appears intact Memory: Impaired Memory Impairment: Retrieval deficit Awareness: Impaired Awareness Impairment: Anticipatory impairment Problem Solving: Impaired Problem Solving Impairment: Verbal complex;Functional complex Executive Function: Organizing;Decision Conservation officer, historic buildings: Impaired Decision Making: Impaired Behaviors: (quite pleasant) Safety/Judgment: Impaired       Comprehension  Auditory Comprehension Overall Auditory Comprehension: Impaired Yes/No Questions: Within Functional Limits Commands: Impaired(w/ multistep) Conversation: Simple Interfering Components: Processing speed EffectiveTechniques: Extra processing time Visual Recognition/Discrimination Discrimination: Not  tested Reading Comprehension Reading Status: Not tested    Expression Expression Primary Mode of Expression: Verbal Verbal Expression Overall Verbal Expression: Appears within functional limits for tasks assessed Initiation: No impairment Automatic Speech: Name;Social  Response;Counting;Day of week;Month of year Level of Generative/Spontaneous Verbalization: Sentence Repetition: No impairment Naming: No impairment Pragmatics: No impairment Interfering Components: Speech intelligibility(Dysfluency) Non-Verbal Means of Communication: Not applicable Written Expression Dominant Hand: Right(tremors noted) Written Expression: Not tested   Oral / Motor  Oral Motor/Sensory Function Overall Oral Motor/Sensory Function: Mild impairment(-Moderate impairment; increased saliva) Facial ROM: Reduced right Facial Symmetry: Abnormal symmetry right Facial Strength: Reduced right Facial Sensation: Reduced right(heaviness) Lingual ROM: Reduced right Lingual Symmetry: Within Functional Limits Lingual Strength: Reduced(w/ movements to R side) Lingual Sensation: Within Functional Limits Velum: Within Functional Limits Mandible: Within Functional Limits Motor Speech Overall Motor Speech: Impaired Respiration: Within functional limits Phonation: Low vocal intensity;Breathy(whispered at times) Resonance: Within functional limits Articulation: Impaired Level of Impairment: Word Intelligibility: Intelligibility reduced Word: 50-74% accurate Motor Planning: Impaired Level of Impairment: Word Motor Speech Errors: Groping for words(min) Effective Techniques: Slow rate;Increased vocal intensity;Over-articulate   GO                      Jerilynn SomKatherine Soul Deveney, MS, CCC-SLP [name] 08/24/2019, 2:50 PM

## 2019-08-24 NOTE — Evaluation (Signed)
Physical Therapy Evaluation Patient Details Name: Jerome Wolfe MRN: [id number] DOB: 23-Nov-1946 Today's Date: 08/24/2019   History of Present Illness  Jerome Wolfe is an 73 y.o. male with a known history of mild cognitive decline (dementia per family) and HTN who presented to the ED with slurred speech and right sided weakness. MRI of the brain showing small infarct in the posterior limb of left internal capsule.  Clinical Impression  Prior to hospital admission, pt was independent.  Pt lives in a one story house with 3 steps to entry and both side of railings to help.  Currently pt is Min A to mod A for bed mobility and sitting balance; Mod A with standing balance and Mod A to Max A for ambulation with RW from bed to recliner. Pt able to follow single commend but has R UE dysmetria presents with coordination testing. Pt tolerate the session well but consistently showing R and posterior leaning throughout the session(see details). Pt states wants to go home regardless this writer states multiple times the benefit of rehab services. Pt would benefit from skilled PT to address noted impairments and functional limitations (see below for any additional details).  Upon hospital discharge, recommend pt discharge to STR to improve coordination, gait, balance for functional mobility.     Follow Up Recommendations SNF    Equipment Recommendations  Rolling walker with 5" wheels;3in1 (PT)    Recommendations for Other Services       Precautions / Restrictions Precautions Precautions: Fall Restrictions Weight Bearing Restrictions: No      Mobility  Bed Mobility Overal bed mobility: Needs Assistance Bed Mobility: Supine to Sit     Supine to sit: Min assist;Mod assist     General bed mobility comments: Able to supine to sit from flat bed but requires additional time and effort; Min A for sitting up right due to pt sitting with R/posterior leaning; Mod A for scooting towards  EOB.  Transfers Overall transfer level: Needs assistance Equipment used: Rolling walker (2 wheeled) Transfers: Sit to/from Stand Sit to Stand: Mod assist         General transfer comment: VC's provided for hand and foot placement; pt able to place one hand over bed correctly but having BUE pulling once standing and PT has to stablize the RW; R/posterior leaning presents once standing; Mod A for maintain standing balance.  Ambulation/Gait Ambulation/Gait assistance: Mod assist;Max assist Gait Distance (Feet): 3 Feet Assistive device: Rolling walker (2 wheeled) Gait Pattern/deviations: Step-to pattern Gait velocity: decrease   General Gait Details: Lack of knee/hip/ankle flexion with stepping; heavy cuing provided for foot sequencing; mod to max A for maintain standing balance while walking due to pt has R/posterior leaning.  Stairs            Wheelchair Mobility    Modified Rankin (Stroke Patients Only)       Balance Overall balance assessment: Needs assistance Sitting-balance support: Single extremity supported Sitting balance-Leahy Scale: Fair   Postural control: Posterior lean;Right lateral lean Standing balance support: Bilateral upper extremity supported Standing balance-Leahy Scale: Poor Standing balance comment: Unable to maintain standing balance and requires mod A for standing balance with BUE on RW.                             Pertinent Vitals/Pain Pain Assessment: Faces Faces Pain Scale: No hurt Pain Intervention(s): Monitored during session    Home Living Family/patient expects to be discharged to::  Private residence Living Arrangements: Spouse/significant other Available Help at Discharge: Family Type of Home: House Home Access: Stairs to enter Entrance Stairs-Rails: Can reach both Entrance Stairs-Number of Steps: 3 Home Layout: One level        Prior Function Level of Independence: Independent               Hand  Dominance   Dominant Hand: Right    Extremity/Trunk Assessment   Upper Extremity Assessment Upper Extremity Assessment: RUE deficits/detail;LUE deficits/detail RUE Deficits / Details: At least 4/5 with RUE grossly and strong hand grip; R side coordination testing finger to nose dysmetria presents; cueing provided for reorient with location of the therapist finger, still unable to point therapist finger. LUE Deficits / Details: At least 4/5 LUE grossly and strong hand grip; tremor presents with coordination testing finger to nose; pt states it is new since this hospitalization    Lower Extremity Assessment Lower Extremity Assessment: RLE deficits/detail;LLE deficits/detail RLE Deficits / Details: At least 4/5 with RLE grossly; decreased sensation to light touch; pt uable to relax the RLE when assess tone at ankle/knee/hip. LLE Deficits / Details: At least 4/5 with LLE grossly; normal sensation to light touch; pt uable to relax the LLE when assess tone at ankle/knee/hip.       Communication   Communication: (Able to follow single commend and answer questions with short phrase)  Cognition Arousal/Alertness: Lethargic Behavior During Therapy: Anxious;Flat affect Overall Cognitive Status: No family/caregiver present to determine baseline cognitive functioning                                        General Comments      Exercises     Assessment/Plan    PT Assessment Patient needs continued PT services  PT Problem List Decreased activity tolerance;Decreased balance;Decreased range of motion;Decreased mobility;Decreased coordination;Decreased knowledge of use of DME;Decreased safety awareness       PT Treatment Interventions DME instruction;Gait training;Functional mobility training;Stair training;Therapeutic activities;Therapeutic exercise;Balance training;Neuromuscular re-education;Patient/family education    PT Goals (Current goals can be found in the Care Plan  section)  Acute Rehab PT Goals Patient Stated Goal: to go home PT Goal Formulation: With patient Time For Goal Achievement: 09/07/19 Potential to Achieve Goals: Good    Frequency 7X/week   Barriers to discharge        Co-evaluation               AM-PAC PT "6 Clicks" Mobility  Outcome Measure Help needed turning from your back to your side while in a flat bed without using bedrails?: A Little Help needed moving from lying on your back to sitting on the side of a flat bed without using bedrails?: A Lot Help needed moving to and from a bed to a chair (including a wheelchair)?: A Lot Help needed standing up from a chair using your arms (e.g., wheelchair or bedside chair)?: A Lot Help needed to walk in hospital room?: A Lot Help needed climbing 3-5 steps with a railing? : A Lot 6 Click Score: 13    End of Session Equipment Utilized During Treatment: Gait belt Activity Tolerance: No increased pain;Patient tolerated treatment well Patient left: in chair;with call bell/phone within reach;with chair alarm set;Other (comment)(one pillow under R UE and one pillow behind back towards R side to correct R/posterior leaning with sitting. BLE heels up and floating off pillow) Nurse Communication: Mobility  status;Weight bearing status;Precautions PT Visit Diagnosis: Unsteadiness on feet (R26.81);Other abnormalities of gait and mobility (R26.89);Difficulty in walking, not elsewhere classified (R26.2)    Time: [id number] PT Time Calculation (min) (ACUTE ONLY): 37 min   Charges:              Nelly RoutNan Favour Aleshire, SPT 08/24/2019, 10:03 AM

## 2019-08-24 NOTE — TOC Progression Note (Deleted)
Transition of Care [facility]) - Progression Note    Patient Details  Name: Jerome Wolfe MRN: [id number] Date of Birth: 1946/07/23  Transition of Care [facility]) CM/SW Contact  Shelbie Hutching, RN Phone Number: 08/24/2019, 4:01 PM  Clinical Narrative:     Patient admitted for a stroke, wife is at the bedside.  Patient is from home with his wife and is independent at baseline.  Patient wants to go home and does not want to go to SNF or inpatient rehab.  Initially patient's wife, Jerome Wolfe wanted to take patient home also with home health but after watching patient get up from the chair and the bed and seeing the amount of assistance he requires she realizes that she cannot take care of him by herself.  PT and OT have recommended SNF.  SNF is short term just to get patient strong enough to be able to go home with home health.  Currently the patient has no equipment at home.  Wife requests that SNF be close to home, bed search has been started - patient lives in [place].    Expected Discharge Plan: Skilled Nursing Facility Barriers to Discharge: SNF Pending bed offer  Expected Discharge Plan and Services Expected Discharge Plan: [place] Choice: [place] arrangements for the past 2 months: Single Family Home Expected Discharge Date: 08/24/19                                     Social Determinants of Health (SDOH) Interventions    Readmission Risk Interventions No flowsheet data found.

## 2019-08-24 NOTE — Progress Notes (Signed)
[place] [facility] [place] NAME: Jerome Wolfe    MR#:  [id number]  DATE OF BIRTH:  1946/11/14  SUBJECTIVE:  CHIEF COMPLAINT:   Chief Complaint  Patient presents with   Weakness   -His facial droop and right-sided weakness are much improved today.  Remains adamant about wanting to go home today.  Awaiting PT eval.  REVIEW OF SYSTEMS:  Review of Systems  Constitutional: Positive for malaise/fatigue. Negative for chills and fever.  HENT: Negative for congestion, ear discharge, hearing loss and nosebleeds.   Eyes: Negative for blurred vision and double vision.  Respiratory: Negative for cough, shortness of breath and wheezing.   Cardiovascular: Negative for chest pain and palpitations.  Gastrointestinal: Negative for abdominal pain, constipation, diarrhea, nausea and vomiting.  Genitourinary: Negative for dysuria.  Musculoskeletal: Negative for myalgias.  Neurological: Positive for speech change and focal weakness. Negative for dizziness, seizures and headaches.  Psychiatric/Behavioral: Negative for depression.    DRUG ALLERGIES:  No Known Allergies  VITALS:  Blood pressure 137/77, pulse 96, temperature 97.9 F (36.6 C), resp. rate 19, height 6\' 2"  (1.88 m), weight 80.7 kg, SpO2 97 %.  PHYSICAL EXAMINATION:  Physical Exam   GENERAL:  73 y.o.-year-old patient lying in the bed with no acute distress.  Very restless and impulsive. EYES: Pupils equal, round, reactive to light and accommodation. No scleral icterus. Extraocular muscles intact.  HEENT: Head atraumatic, normocephalic. Oropharynx and nasopharynx clear.  NECK:  Supple, no jugular venous distention. No thyroid enlargement, no tenderness.  LUNGS: Normal breath sounds bilaterally, no wheezing, rales,rhonchi or crepitation. No use of accessory muscles of respiration.  Decreased bibasilar breath sounds CARDIOVASCULAR: S1, S2 normal. No  rubs, or gallops.  3/6 systolic murmur is  present ABDOMEN: Soft, nontender, nondistended. Bowel sounds present. No organomegaly or mass.  EXTREMITIES: No pedal edema, cyanosis, or clubbing.  NEUROLOGIC: Cranial nerves II through XII are intact.  Right facial droop much improved today.  Muscle strength equal in all extremities. Sensation intact. Gait not checked.  PSYCHIATRIC: The patient is alert and oriented x 3.  Speech is much clearer today SKIN: No obvious rash, lesion, or ulcer.    LABORATORY PANEL:   CBC Recent Labs  Lab 08/23/19 0542  WBC 9.6  HGB 14.5  HCT 45.9  PLT 272   ------------------------------------------------------------------------------------------------------------------  Chemistries  Recent Labs  Lab 08/22/19 1334 08/23/19 0542  NA 138 141  K 3.8 3.6  CL 104 106  CO2 23 23  GLUCOSE 97 101*  BUN 15 16  CREATININE 1.12 1.21  CALCIUM 9.7 9.7  AST 23  --   ALT 16  --   ALKPHOS 67  --   BILITOT 0.7  --    ------------------------------------------------------------------------------------------------------------------  Cardiac Enzymes No results for input(s): TROPONINI in the last 168 hours. ------------------------------------------------------------------------------------------------------------------  RADIOLOGY:  Ct Head Wo Contrast  Result Date: 08/23/2019 CLINICAL DATA:  Slurred speech, right-sided weakness. EXAM: CT HEAD WITHOUT CONTRAST TECHNIQUE: Contiguous axial images were obtained from the base of the skull through the vertex without intravenous contrast. COMPARISON:  08/22/2019 CT and MRI FINDINGS: Brain: There is atrophy and chronic small vessel disease changes. Previously seen infarct in the posterior limb of the left internal capsule seen. No new infarcts, hemorrhage or hydrocephalus. No mass effect or midline shift. Vascular: No hyperdense vessel or unexpected calcification. Skull: No acute calvarial abnormality. Sinuses/Orbits: Visualized paranasal sinuses and mastoids  clear. Orbital soft tissues unremarkable. Other: None IMPRESSION: Infarct again  noted in the posterior limb of the left internal capsule as seen on prior MRI. No change. No new infarct. Atrophy, chronic microvascular disease. Electronically Signed   By: Charlett NoseKevin  Dover M.D.   On: 08/23/2019 15:48   Ct Head Wo Contrast  Result Date: 08/22/2019 CLINICAL DATA:  Weakness, intermittent speech difficulty, dementia EXAM: CT HEAD WITHOUT CONTRAST TECHNIQUE: Contiguous axial images were obtained from the base of the skull through the vertex without intravenous contrast. COMPARISON:  None. FINDINGS: Brain: No evidence of acute infarction, hemorrhage, hydrocephalus, extra-axial collection or mass lesion/mass effect. Subcortical white matter and periventricular small vessel ischemic changes. Vascular: No hyperdense vessel or unexpected calcification. Skull: Normal. Negative for fracture or focal lesion. Sinuses/Orbits: The visualized paranasal sinuses are essentially clear. The mastoid air cells are unopacified. Other: None. IMPRESSION: No evidence of acute intracranial abnormality. Small vessel ischemic changes. Electronically Signed   By: Charline BillsSriyesh  Krishnan M.D.   On: 08/22/2019 14:15   Mr Brain Wo Contrast  Result Date: 08/22/2019 CLINICAL DATA:  Slurred speech and right-sided weakness starting this morning EXAM: MRI HEAD WITHOUT CONTRAST TECHNIQUE: Multiplanar, multiecho pulse sequences of the brain and surrounding structures were obtained without intravenous contrast. COMPARISON:  None. FINDINGS: Brain: 1 cm ovoid infarct in the posterior limb left internal capsule. Subcentimeter focus of weak diffusion restriction in the right frontal white matter and in the left deep white matter adjacent to the frontal horn of the left lateral ventricle. Background of extensive chronic small vessel ischemic type change the cerebral white matter where there is confluent FLAIR hyperintensity which even extends into the external  capsules and temporal lobes. Minor small vessel ischemic type change in the pons. No acute hemorrhage, hydrocephalus, or masslike finding. Isointense nodular appearance along the frontal horn of the left lateral ventricle, favor heterotopic gray matter which would be incidental. Vascular: Major flow voids are preserved Skull and upper cervical spine: Negative for marrow lesion. Sinuses/Orbits: Negative for acute finding. Bilateral cataract resection IMPRESSION: 1. Acute lacunar infarct in the posterior limb left internal capsule. 2. Subacute appearing lacunar infarcts in the bifrontal white matter. 3. 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 Electronically Signed   By: Marnee SpringJonathon  Watts M.D.   On: 08/22/2019 19:13   [place] Carotid Bilateral (at [facility] And [facility] Only)  Result Date: 08/23/2019 CLINICAL DATA:  TIA, hypertension and tobacco use. EXAM: BILATERAL CAROTID DUPLEX ULTRASOUND TECHNIQUE: Wallace CullensGray scale imaging, color Doppler and duplex ultrasound were performed of bilateral carotid and vertebral arteries in the neck. COMPARISON:  None. FINDINGS: Criteria: Quantification of carotid stenosis is based on velocity parameters that correlate the residual internal carotid diameter with NASCET-based stenosis levels, using the diameter of the distal internal carotid lumen as the denominator for stenosis measurement. The following velocity measurements were obtained: RIGHT ICA:  82/30 cm/sec CCA:  65/17 cm/sec SYSTOLIC ICA/CCA RATIO:  1.2 ECA:  98 cm/sec LEFT ICA:  89/29 cm/sec CCA:  98/22 cm/sec SYSTOLIC ICA/CCA RATIO:  0.9 ECA:  134 cm/sec RIGHT CAROTID ARTERY: Mild to moderate partially calcified plaque at the level of the right carotid bulb. Mild amount of plaque present at the right ICA origin. Estimated right ICA stenosis is less than 50%. RIGHT VERTEBRAL ARTERY: Antegrade flow with normal waveform and velocity. LEFT CAROTID ARTERY: Mild amount of calcified plaque at the level of the left carotid bulb. Mild plaque  at the left ICA origin. Estimated left ICA stenosis is less than 50%. LEFT VERTEBRAL ARTERY: Antegrade flow with normal waveform and velocity. IMPRESSION: Calcified  plaque at the level of both carotid bulbs, right greater than left. Mild plaque at the level of both ICA origins. Estimated bilateral ICA stenoses are less than 50%. Electronically Signed   By: Irish LackGlenn  Yamagata M.D.   On: 08/23/2019 08:41   Dg Chest Portable 1 View  Result Date: 08/22/2019 CLINICAL DATA:  AMS, evaluate for pneumonia EXAM: PORTABLE CHEST 1 VIEW COMPARISON:  03/28/2017 FINDINGS: The heart size and mediastinal contours are within normal limits. Emphysema. Probable mild bibasilar fibrotic change. No acute appearing airspace opacity. The visualized skeletal structures are unremarkable. IMPRESSION: Emphysema. Probable mild bibasilar fibrotic change. No acute appearing airspace opacity. Electronically Signed   By: Lauralyn PrimesAlex  Bibbey M.D.   On: 08/22/2019 14:24    EKG:   Orders placed or performed during the hospital encounter of 08/22/19   ED EKG   ED EKG   EKG 12-Lead   EKG 12-Lead    ASSESSMENT AND PLAN:   73 year old male with past medical history significant for hypertension, mild cognitive decline presents to hospital secondary to right-sided weakness and slurred speech.  1.  Acute stroke-patient presenting with right facial droop and mild right-sided weakness. -MRI of the brain showing small infarct in the posterior limb of left internal capsule.  Also bifrontal old infarcts noted. -Carotid Dopplers showing plaque buildup but less than 50% stenosis. -Echocardiogram is done this morning -Started on aspirin and Plavix, continue dual antiplatelet agent therapy for 3 weeks followed by aspirin alone. -Increase his dose of statin as LDL greater than 170 -PT/OT and speech therapy consults are pending -Outpatient neurology follow-up.  Might need cardiac monitoring as outpatient.  2.  Hypertension-not on any medications.   Blood pressure is stable  3.  Hyperlipidemia-LDL of 179, with acute stroke, changed to high intensity statin at 80 mg  4.  DVT prophylaxis-Lovenox  5.  Mild delirium in the hospital-improved with Zyprexa, slept better last night.  Impulsive at baseline  PT and OT consults pending.  Anticipate discharge today    All the records are reviewed and case discussed with Care Management/Social Workerr. Management plans discussed with the patient, family and they are in agreement.  CODE STATUS: Full code  TOTAL TIME TAKING CARE OF THIS PATIENT: 38 minutes.   POSSIBLE D/C IN 1-2 DAYS, DEPENDING ON CLINICAL CONDITION.   Enid Baasadhika Kristy Catoe M.D on 08/24/2019 at 10:16 AM  Between 7am to 6pm - Pager - [phone]  After 6pm go to [email or web address] - Social research officer, governmentpassword EPAS [facility]  [place] [facility]  Office  [phone]  CC: Primary care physician; Lance BoschWarshaw, Gregg A, MD

## 2019-08-24 NOTE — TOC Progression Note (Signed)
Transition of Care [facility]) - Progression Note    Patient Details  Name: [name] MRN: [id number] Date of Birth: 09/12/1946  Transition of Care [facility]) CM/SW Contact  Shelbie Hutching, RN Phone Number: 08/24/2019, 4:18 PM  Clinical Narrative:    [facility] in [place] has offered a bed and wife accepts.  Patient should be ready for discharge tomorrow.     Expected Discharge Plan: Skilled Nursing Facility Barriers to Discharge: SNF Pending bed offer  Expected Discharge Plan and Services Expected Discharge Plan: [place] Choice: [place] arrangements for the past 2 months: Single Family Home Expected Discharge Date: 08/24/19                                     Social Determinants of Health (SDOH) Interventions    Readmission Risk Interventions No flowsheet data found.

## 2019-08-24 NOTE — Progress Notes (Signed)
*  PRELIMINARY RESULTS* Echocardiogram 2D Echocardiogram has been performed.  Jerome Wolfe 08/24/2019, 9:01 AM

## 2019-08-24 NOTE — [place] FL2 (Signed)
[place] MEDICAID FL2 LEVEL OF CARE SCREENING TOOL     IDENTIFICATION  Patient Name: Jerome Wolfe Birthdate: November 02, 1946 Sex: male Admission Date (Current Location): 08/22/2019  [place]   Facility and Address:  [facility], [address], [place], [place] [address]      Provider Number: [id number]  Attending Physician Name and Address:  Enid BaasKalisetti, Radhika, MD  Relative Name and Phone Number:  Norberto SorensonSandra Hamor [phone]    Current Level of Care: Hospital Recommended Level of Care: Skilled Nursing Facility Prior Approval Number:    Date Approved/Denied:   PASRR Number: pending  Discharge Plan: SNF    Current Diagnoses: Patient Active Problem List   Diagnosis Date Noted  . 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 Slurred speech 08/22/2019    Orientation RESPIRATION BLADDER Height & Weight     Self, Time, Situation, Place  Normal Continent Weight: 80.7 kg Height:  6\' 2"  (188 cm)  BEHAVIORAL SYMPTOMS/MOOD NEUROLOGICAL BOWEL NUTRITION STATUS      Continent Diet(Heart healthy)  AMBULATORY STATUS COMMUNICATION OF NEEDS Skin   Extensive Assist Verbally Normal                       Personal Care Assistance Level of Assistance  Bathing, Feeding, Dressing Bathing Assistance: Limited assistance Feeding assistance: Limited assistance Dressing Assistance: Limited assistance     Functional Limitations Info             SPECIAL CARE FACTORS FREQUENCY  PT (By licensed PT), OT (By licensed OT), Speech therapy     PT Frequency: 7 times per week OT Frequency: 2 times per week     Speech Therapy Frequency: evaluate and treat at facility      Contractures Contractures Info: Not present    Additional Factors Info  Code Status, Allergies Code Status Info: Full Allergies Info: NKDA           Current Medications (08/24/2019):  This is the current hospital active medication list Current  Facility-Administered Medications  Medication Dose Route Frequency Provider Last Rate Last Dose  . acetaminophen (TYLENOL) tablet 650 mg  650 mg Oral Q4H PRN Mayo, Allyn KennerKaty Dodd, MD       Or  . acetaminophen (TYLENOL) solution 650 mg  650 mg Per Tube Q4H PRN Mayo, Allyn KennerKaty Dodd, MD       Or  . acetaminophen (TYLENOL) suppository 650 mg  650 mg Rectal Q4H PRN Mayo, Allyn KennerKaty Dodd, MD      . albuterol (VENTOLIN HFA) 108 (90 Base) MCG/ACT inhaler 2 puff  2 puff Inhalation Q4H PRN Mayo, Allyn KennerKaty Dodd, MD      . aspirin EC tablet 81 mg  81 mg Oral Daily Mayo, Allyn KennerKaty Dodd, MD   81 mg at 08/24/19 0945  . atorvastatin (LIPITOR) tablet 80 mg  80 mg Oral q1800 Enid BaasKalisetti, Radhika, MD   80 mg at 08/23/19 1712  . clopidogrel (PLAVIX) tablet 75 mg  75 mg Oral Daily Enid BaasKalisetti, Radhika, MD   75 mg at 08/24/19 0945  . dorzolamide (TRUSOPT) 2 % ophthalmic solution 1 drop  1 drop Both Eyes TID Enid BaasKalisetti, Radhika, MD   1 drop at 08/24/19 0946  . enoxaparin (LOVENOX) injection 40 mg  40 mg Subcutaneous Q24H Mayo, Allyn KennerKaty Dodd, MD   40 mg at 08/23/19 1712  . fluticasone (FLONASE) 50 MCG/ACT nasal spray 2 spray  2 spray Each Nare Daily Enid BaasKalisetti, Radhika, MD   2 spray at 08/24/19  0946  . latanoprost (XALATAN) 0.005 % ophthalmic solution 1 drop  1 drop Both Eyes QHS Gladstone Lighter, MD      . LORazepam (ATIVAN) injection 1 mg  1 mg Intravenous Q6H PRN Gladstone Lighter, MD   1 mg at 08/23/19 1815  . mometasone-formoterol (DULERA) 200-5 MCG/ACT inhaler 2 puff  2 puff Inhalation BID Gladstone Lighter, MD   2 puff at 08/24/19 0946  . OLANZapine (ZYPREXA) tablet 2.5 mg  2.5 mg Oral QHS Gladstone Lighter, MD      . pyridOXINE (VITAMIN B-6) tablet 100 mg  100 mg Oral Daily Gladstone Lighter, MD   100 mg at 08/24/19 0945  . senna-docusate (Senokot-S) tablet 1 tablet  1 tablet Oral QHS PRN Mayo, Pete Pelt, MD      . sertraline (ZOLOFT) tablet 75 mg  75 mg Oral Daily Gladstone Lighter, MD   75 mg at 08/24/19 0945  . vitamin B-12  (CYANOCOBALAMIN) tablet 100 mcg  100 mcg Oral Daily Gladstone Lighter, MD   100 mcg at 08/24/19 9924     Discharge Medications: Please see discharge summary for a list of discharge medications.  Relevant Imaging Results:  Relevant Lab Results:   Additional Information SS# [id number]  Shelbie Hutching, RN

## 2019-08-24 NOTE — Progress Notes (Addendum)
Patient slept well throughout the night, but refused to participate in assessments. Patient is disoriented x4 Stating his name is "Jerome Wolfe". Patient is more alert and anxious this morning, requesting to go home. Patient has been more cooperative in assessments as the night progressed. VS stable, mouth care completed, Will continue to monitor.

## 2019-08-25 MED ORDER — FLEET ENEMA 7-19 GM/118ML RE ENEM: 1.0000 | ENEMA | Freq: Once | RECTAL | Status: DC

## 2019-08-25 MED ORDER — POLYETHYLENE GLYCOL 3350 17 G PO PACK
17.0000 g | PACK | Freq: Every day | ORAL | Status: DC
  Administered 2019-08-25: 17 g via ORAL
  Filled 2019-08-25: qty 1

## 2019-08-25 MED ORDER — BISACODYL 10 MG RE SUPP
10.0000 mg | Freq: Every day | RECTAL | Status: DC
  Administered 2019-08-25: 10 mg via RECTAL
  Filled 2019-08-25: qty 1

## 2019-08-25 NOTE — Plan of Care (Signed)
Pt was admitted for stroke - continues to have some dysarthria and generalized weakness.  Did move from bed to chair and bed to [facility] today. Pt is d/ced to [facility] for rehab.  Called report to [facility].  Pt is going to Rm E14.  Pt had to have a BM before going and after miralax and a dulcolax suppository we were successful.  IV was removed.  EMS called for transport.

## 2019-08-25 NOTE — TOC Transition Note (Signed)
Transition of Care [facility]) - CM/SW Discharge Note   Patient Details  Name: Jerome Wolfe MRN: [id number] Date of Birth: [birth year]  Transition of Care [facility]) CM/SW Contact:  Shelbie Hutching, RN Phone Number: 08/25/2019, 2:15 PM   Clinical Narrative:     Patient is discharging to [facility] in [place].  Pasrr # back [id number] A- no expiration date.  Patient is going to room E14.  Bedside RN to call report to [phone].  [facility] will transport patient to [facility] facility.    Final next level of care: Skilled Nursing Facility Barriers to Discharge: Barriers Resolved   Patient Goals and CMS Choice Patient states their goals for this hospitalization and ongoing recovery are:: Patient wants to go home today CMS Medicare.gov Compare Post Acute Care list provided to:: Patient Represenative (must comment) Choice offered to / list presented to : Spouse  Discharge Placement PASRR number recieved: 08/25/19            Patient chooses bed at: [facility] [place] Patient to be transferred to facility by: [facility] Name of family member notified: Patient's wife [name] Patient and family notified of of transfer: 08/25/19  Discharge Plan and Services     Post Acute Care Choice: [place]                               Social Determinants of Health (SDOH) Interventions     Readmission Risk Interventions No flowsheet data found.

## 2019-08-25 NOTE — Discharge Summary (Signed)
[place] [facility] [place] NAME: Jerome Wolfe    MR#:  [id number]  DATE OF BIRTH:  10/29/1946  DATE OF ADMISSION:  08/22/2019   ADMITTING PHYSICIAN: Sela Hua, MD  DATE OF DISCHARGE:  08/25/19  PRIMARY CARE PHYSICIAN: Hoover Brunette, MD   ADMISSION DIAGNOSIS:   TIA (transient ischemic attack) [G45.9]  DISCHARGE DIAGNOSIS:   Active Problems:   Slurred speech   Stroke (cerebrum) ([place])   SECONDARY DIAGNOSIS:   Past Medical History:  Diagnosis Date  . Hypertension     HOSPITAL COURSE:   73 year old male with past medical history significant for hypertension, mild cognitive decline presents to hospital secondary to right-sided weakness and slurred speech.  1.  Acute stroke-patient presenting with right facial droop and mild right-sided weakness. -MRI of the brain showing small infarct in the posterior limb of left internal capsule.  Also bifrontal old infarcts noted. -Carotid Dopplers showing plaque buildup but less than 50% stenosis. -Echocardiogram is done this morning -Started on aspirin and Plavix, continue dual antiplatelet agent therapy for 3 weeks followed by aspirin alone. -Increase his dose of statin as LDL greater than 170 -PT and OT recommended rehab.  Will also need speech services after discharge. -Outpatient neurology follow-up.  Might need cardiac monitoring as outpatient.  2.  Hypertension-not on any medications.  Blood pressure is stable  3.  Hyperlipidemia-LDL of 179, with acute stroke, changed to high intensity statin at 80 mg  4.    PTSD-continue Zoloft, also on Seroquel  5.  Mild delirium in the hospital-improved with Zyprexa, slept better last night.  Impulsive at baseline  Possible discharge to short-term rehab today.  Wife updated at bedside    DISCHARGE CONDITIONS:   Guarded  CONSULTS OBTAINED:   Treatment Team:  Leotis Pain, MD  DRUG ALLERGIES:   No Known Allergies  DISCHARGE MEDICATIONS:   Allergies as of 08/25/2019   No Known Allergies     Medication List    STOP taking these medications   azithromycin 500 MG tablet Commonly known as: ZITHROMAX   chlorpheniramine-HYDROcodone 10-8 MG/5ML Suer Commonly known as: Tussionex Pennkinetic ER   hydrochlorothiazide 25 MG tablet Commonly known as: HYDRODIURIL   predniSONE 10 MG (21) Tbpk tablet Commonly known as: STERAPRED UNI-PAK 21 TAB   simvastatin 80 MG tablet Commonly known as: ZOCOR     TAKE these medications   albuterol 108 (90 Base) MCG/ACT inhaler Commonly known as: VENTOLIN HFA Inhale 2 puffs into the lungs every 4 (four) hours as needed for wheezing.   amLODipine 5 MG tablet Commonly known as: NORVASC Take 1 tablet (5 mg total) by mouth daily. What changed:   medication strength  how much to take   aspirin 81 MG EC tablet Take 1 tablet (81 mg total) by mouth daily.   atorvastatin 80 MG tablet Commonly known as: LIPITOR Take 1 tablet (80 mg total) by mouth daily at 6 PM.   cholecalciferol 25 MCG (1000 UT) tablet Commonly known as: VITAMIN D3 Take 1,000 Units by mouth daily.   clopidogrel 75 MG tablet Commonly known as: PLAVIX Take 1 tablet (75 mg total) by mouth daily. For 3 weeks and stop   dorzolamide 2 % ophthalmic solution Commonly known as: TRUSOPT Place 1 drop into both eyes 3 (three) times daily.   ferrous sulfate 325 (65 FE) MG tablet Take 1 tablet by mouth daily.   fluticasone 50 MCG/ACT nasal spray Commonly known as: [place]  2 sprays into both nostrils daily.   latanoprost 0.005 % ophthalmic solution Commonly known as: XALATAN Place 1 drop into both eyes at bedtime.   pyridOXINE 100 MG tablet Commonly known as: VITAMIN B-6 Take 100 mg by mouth daily.   QUEtiapine 25 MG tablet Commonly known as: SEROQUEL Take 12.5 mg by mouth at bedtime.   selenium sulfide 1 % Lotn Commonly known as: SELSUN Apply 1 application topically daily.    sertraline 50 MG tablet Commonly known as: ZOLOFT Take 75 mg by mouth daily.   Symbicort 160-4.5 MCG/ACT inhaler Generic drug: budesonide-formoterol Inhale 2 puffs into the lungs 2 (two) times daily.   vitamin B-12 100 MCG tablet Commonly known as: CYANOCOBALAMIN Take 100 mcg by mouth daily.            Durable Medical Equipment  (From admission, onward)         Start     Ordered   08/24/19 1337  For home use only DME Walker rolling  Once    Question:  Patient needs a walker to treat with the following condition  Answer:  Stroke (cerebrum) (HCC)   08/24/19 1337           DISCHARGE INSTRUCTIONS:   1.  PCP follow-up in 1 to 2 weeks 2.  Cardiology follow-up in 1 to 2 weeks for cardiac monitor as outpatient 3.  Neurology follow-up in 2 weeks 4.  No driving advised  DIET:   Cardiac diet  ACTIVITY:   Activity as tolerated  OXYGEN:   Home Oxygen: No.  Oxygen Delivery: room air  DISCHARGE LOCATION:   nursing home   If you experience worsening of your admission symptoms, develop shortness of breath, life threatening emergency, suicidal or homicidal thoughts you must seek medical attention immediately by calling 911 or calling your MD immediately  if symptoms less severe.  You Must read complete instructions/literature along with all the possible adverse reactions/side effects for all the Medicines you take and that have been prescribed to you. Take any new Medicines after you have completely understood and accpet all the possible adverse reactions/side effects.   Please note  You were cared for by a hospitalist during your hospital stay. If you have any questions about your discharge medications or the care you received while you were in the hospital after you are discharged, you can call the unit and asked to speak with the hospitalist on call if the hospitalist that took care of you is not available. Once you are discharged, your primary care physician will  handle any further medical issues. Please note that NO REFILLS for any discharge medications will be authorized once you are discharged, as it is imperative that you return to your primary care physician (or establish a relationship with a primary care physician if you do not have one) for your aftercare needs so that they can reassess your need for medications and monitor your lab values.    On the day of Discharge:  VITAL SIGNS:   Blood pressure 114/82, pulse 89, temperature 98.3 F (36.8 C), temperature source Oral, resp. rate 19, height 6\' 2"  (1.88 m), weight 80.7 kg, SpO2 100 %.  PHYSICAL EXAMINATION:    GENERAL:  73 y.o.-year-old patient lying in the bed with no acute distress.  Very restless and impulsive. EYES: Pupils equal, round, reactive to light and accommodation. No scleral icterus. Extraocular muscles intact.  HEENT: Head atraumatic, normocephalic. Oropharynx and nasopharynx clear.  NECK:  Supple, no jugular venous  distention. No thyroid enlargement, no tenderness.  LUNGS: Normal breath sounds bilaterally, no wheezing, rales,rhonchi or crepitation. No use of accessory muscles of respiration.  Decreased bibasilar breath sounds CARDIOVASCULAR: S1, S2 normal. No  rubs, or gallops.  3/6 systolic murmur is present ABDOMEN: Soft, nontender, nondistended. Bowel sounds present. No organomegaly or mass.  EXTREMITIES: No pedal edema, cyanosis, or clubbing.  NEUROLOGIC: Cranial nerves II through XII are intact.  Right facial droop much improved today.  Muscle strength equal in all extremities except right sided drift noted in right upper extremity. Sensation intact. Gait not checked.  PSYCHIATRIC: The patient is alert and oriented x 3.  Speech is much clearer today SKIN: No obvious rash, lesion, or ulcer.   DATA REVIEW:   CBC Recent Labs  Lab 08/23/19 0542  WBC 9.6  HGB 14.5  HCT 45.9  PLT 272    Chemistries  Recent Labs  Lab 08/22/19 1334 08/23/19 0542  NA 138 141  K  3.8 3.6  CL 104 106  CO2 23 23  GLUCOSE 97 101*  BUN 15 16  CREATININE 1.12 1.21  CALCIUM 9.7 9.7  AST 23  --   ALT 16  --   ALKPHOS 67  --   BILITOT 0.7  --      Microbiology Results  Results for orders placed or performed during the hospital encounter of 08/22/19  SARS CORONAVIRUS 2 Nasal Swab Aptima Multi Swab     Status: None   Collection Time: 08/22/19  2:51 PM   Specimen: Aptima Multi Swab; Nasal Swab  Result Value Ref Range Status   SARS Coronavirus 2 NEGATIVE NEGATIVE Final    Comment: (NOTE) SARS-CoV-2 target nucleic acids are NOT DETECTED. The SARS-CoV-2 RNA is generally detectable in upper and lower respiratory specimens during the acute phase of infection. Negative results do not preclude SARS-CoV-2 infection, do not rule out co-infections with other pathogens, and should not be used as the sole basis for treatment or other patient management decisions. Negative results must be combined with clinical observations, patient history, and epidemiological information. The expected result is Negative. Fact Sheet for Patients: HairSlick.nohttps://www.fda.gov/media/138098/download Fact Sheet for Healthcare Providers: quierodirigir.comhttps://www.fda.gov/media/138095/download This test is not yet approved or cleared by the [place] FDA and  has been authorized for detection and/or diagnosis of SARS-CoV-2 by FDA under an Emergency Use Authorization (EUA). This EUA will remain  in effect (meaning this test can be used) for the duration of the COVID-19 declaration under Section 56 4(b)(1) of the Act, 21 U.S.C. section 360bbb-3(b)(1), unless the authorization is terminated or revoked sooner. Performed at [facility], [address] [place], [place] [address]     RADIOLOGY:  No results found.   Management plans discussed with the patient, family and they are in agreement.  CODE STATUS:     Code Status Orders  (From admission, onward)         Start     Ordered   08/22/19  1709  Full code  Continuous     08/22/19 1709        Code Status History    This patient has a current code status but no historical code status.   Advance Care Planning Activity    Advance Directive Documentation     Most Recent Value  Type of Advance Directive  Living will  Pre-existing out of facility DNR order (yellow form or pink MOST form)  -  "MOST" Form in Place?  -  TOTAL TIME TAKING CARE OF THIS PATIENT: 38  minutes.    Enid Baasadhika Antha Niday M.D on 08/25/2019 at 10:55 AM  Between 7am to 6pm - Pager - [phone]  After 6pm go to [email or web address] - Scientist, research (life sciences)password EPAS [facility]  [facility]  Office  [phone]  CC: Primary care physician; Lance BoschWarshaw, Gregg A, MD   Note: This dictation was prepared with Dragon dictation along with smaller phrase technology. Any transcriptional errors that result from this process are unintentional.

## 2019-10-08 IMAGING — CT CT HEAD WITHOUT CONTRAST
3 series · 15 of 47 positions shown, 18 images · non-contrast
Comparison: 08/22/2019 CT and MRI

CLINICAL DATA: Slurred speech, right-sided weakness.

EXAM:
CT HEAD WITHOUT CONTRAST
TECHNIQUE: Contiguous axial images were obtained from the base of the skull
through the vertex without intravenous contrast.

[Series 2: head wo · axial · 0.47mm/px · z∈[-126,-1]mm · 9 of 30 slices shown, 12 images]
[im 3/30  brain]
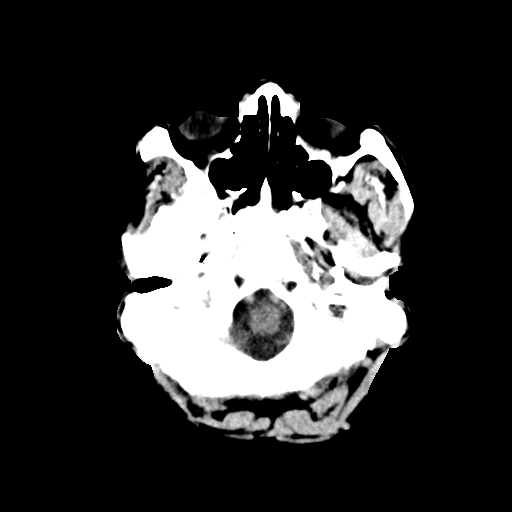
[im 3/30  bone]
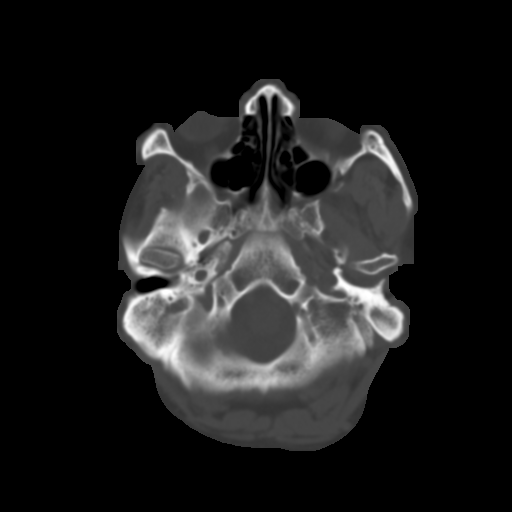
[im 6/30  brain]
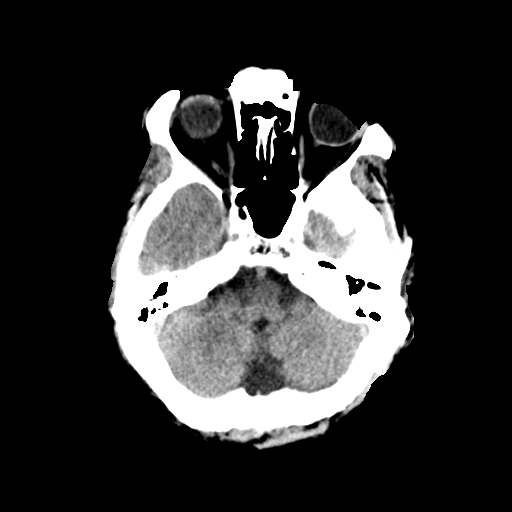
[im 9/30  brain]
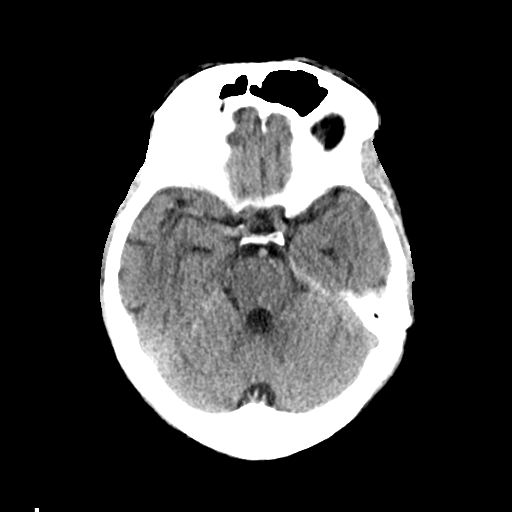
[im 12/30  brain]
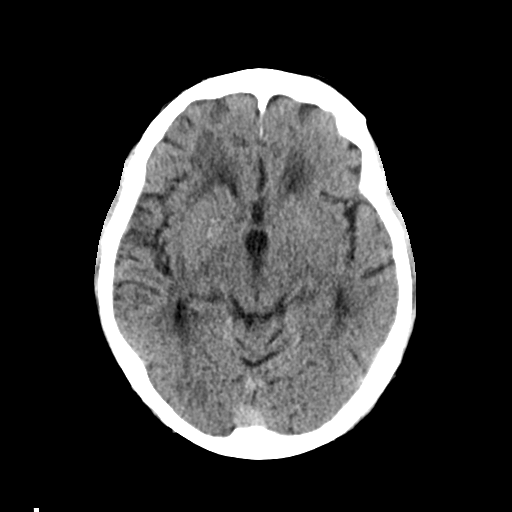
[im 16/30  brain]
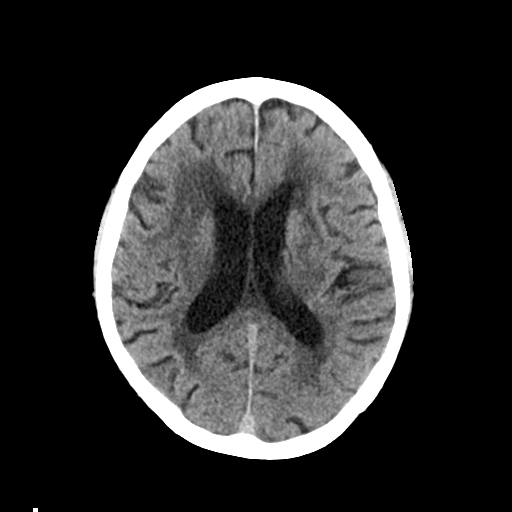
[im 16/30  bone]
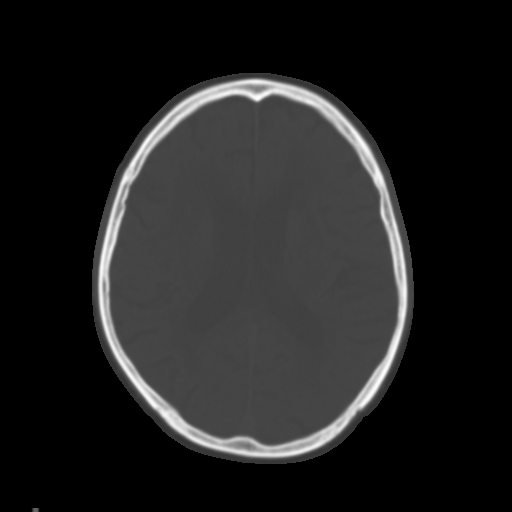
[im 19/30  brain]
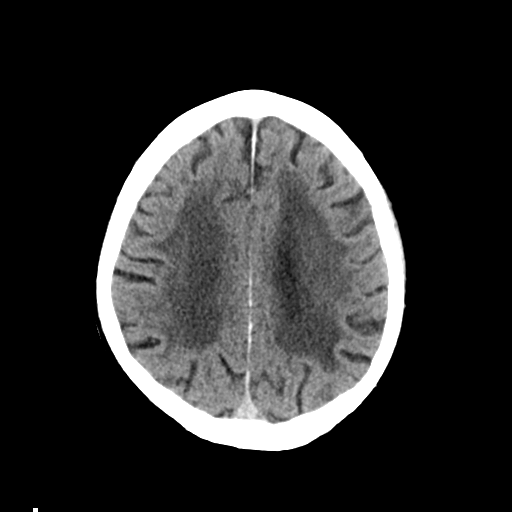
[im 22/30  brain]
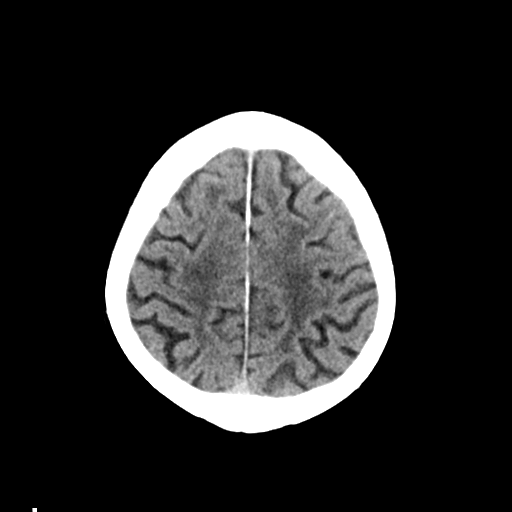
[im 25/30  brain]
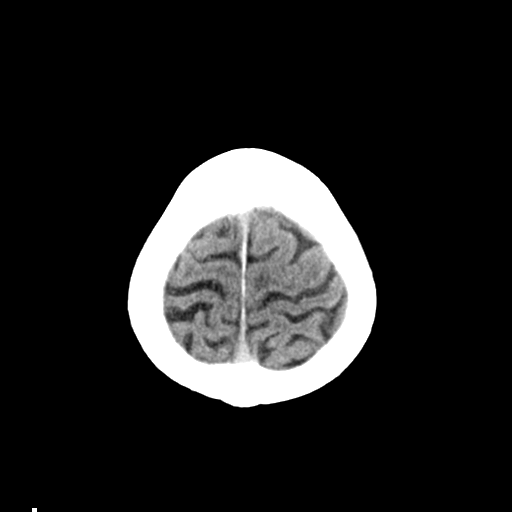
[im 28/30  brain]
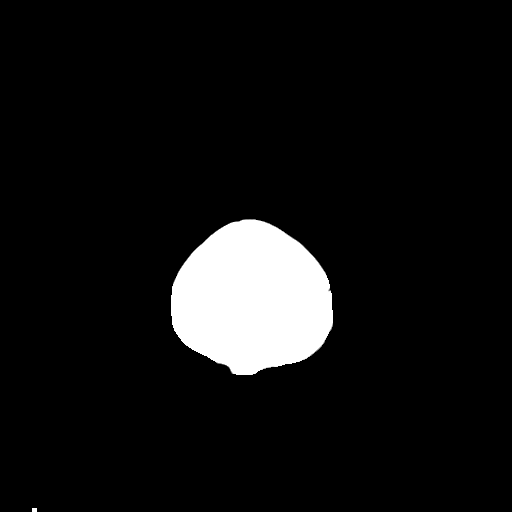
[im 28/30  bone]
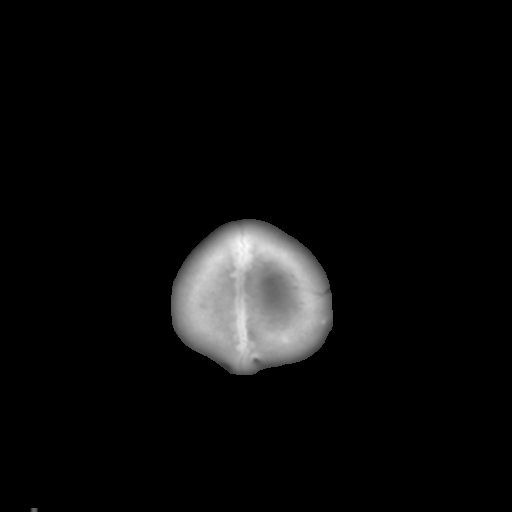

[Series 4: coronal soft tissue · coronal · 0.29mm/px · 3 of 67 slices shown]
[im 23/67  brain]
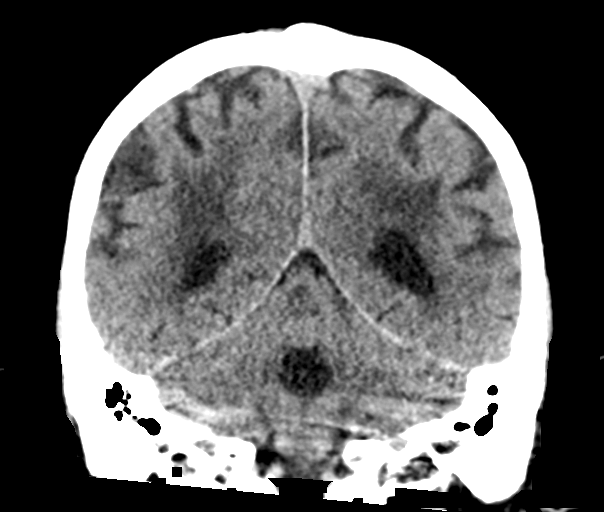
[im 30/67  brain]
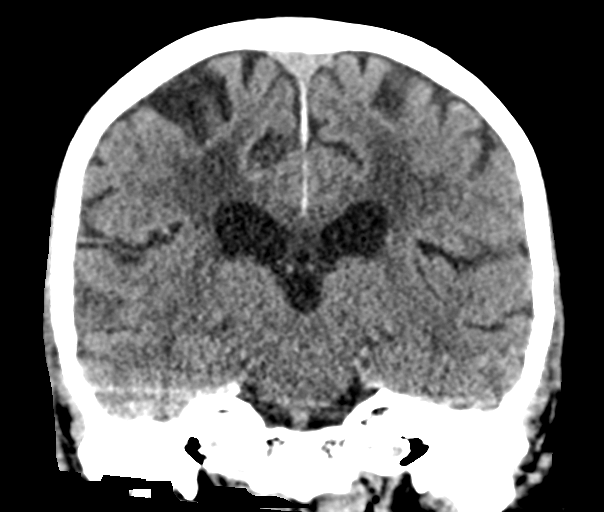
[im 37/67  brain]
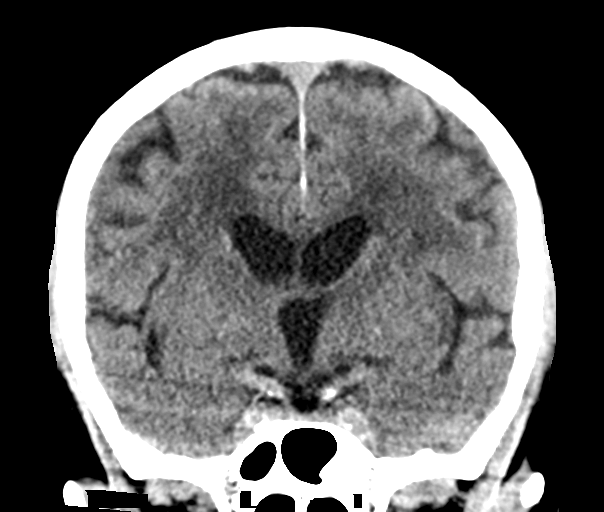

[Series 5: sagittal soft tissue · sagittal · 0.29mm/px · 3 of 57 slices shown]
[im 19/57  brain]
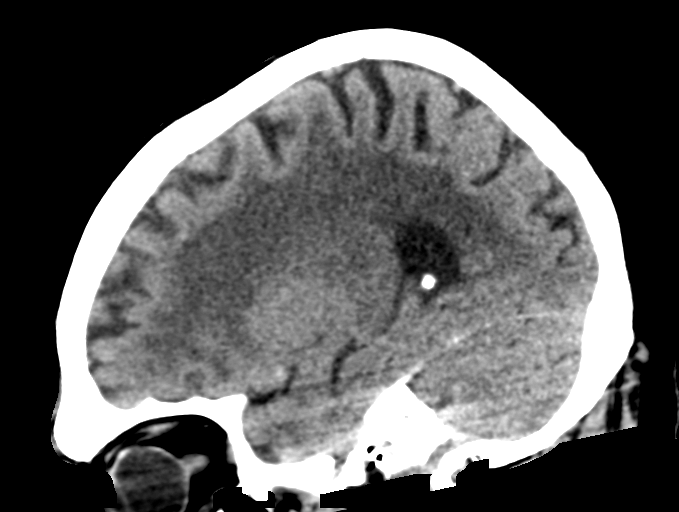
[im 29/57  brain]
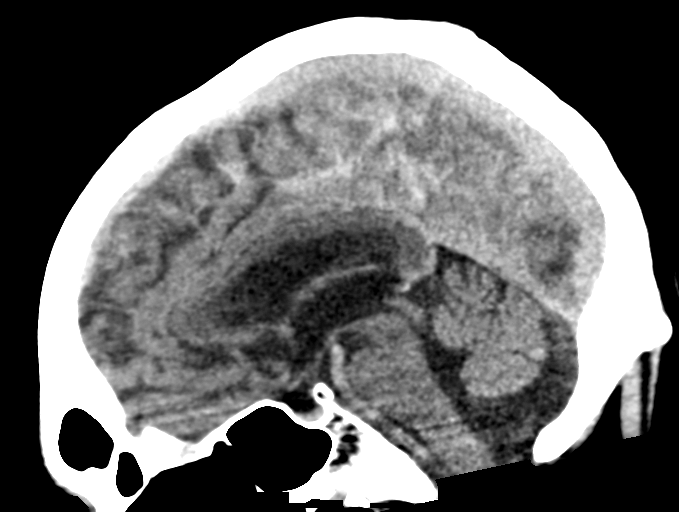
[im 38/57  brain]
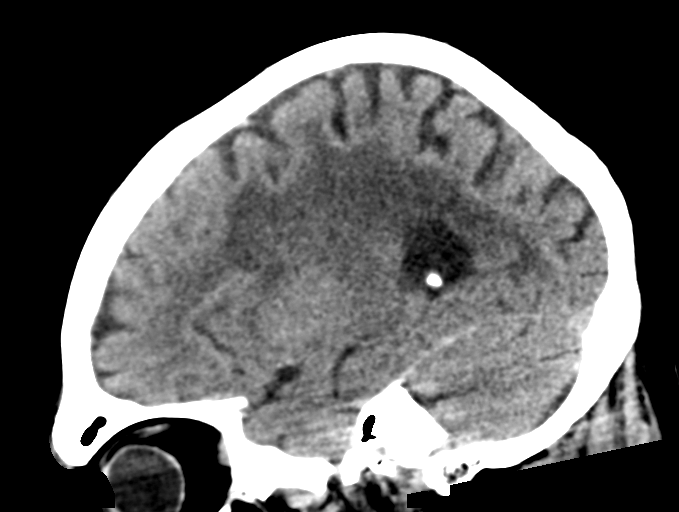

[15 of 47 positions shown; findings below may reference images not displayed]

FINDINGS: Brain: There is atrophy and chronic small vessel disease changes.
Previously seen infarct in the posterior limb of the left internal
capsule seen. No new infarcts, hemorrhage or hydrocephalus. No mass
effect or midline shift.

Vascular: No hyperdense vessel or unexpected calcification.

Skull: No acute calvarial abnormality.

Sinuses/Orbits: Visualized paranasal sinuses and mastoids clear.
Orbital soft tissues unremarkable.

Other: None
IMPRESSION: Infarct again noted in the posterior limb of the left internal
capsule as seen on prior MRI. No change. No new infarct.

Atrophy, chronic microvascular disease.

## 2020-01-01 DIAGNOSIS — I639 Cerebral infarction, unspecified: Secondary | ICD-10-CM

## 2020-01-01 HISTORY — DX: Cerebral infarction, unspecified: I63.9

## 2022-08-21 ENCOUNTER — Emergency Department: Payer: Medicare Other

## 2022-08-21 ENCOUNTER — Other Ambulatory Visit: Payer: Self-pay

## 2022-08-21 ENCOUNTER — Encounter: Payer: Self-pay | Admitting: Emergency Medicine

## 2022-08-21 ENCOUNTER — Emergency Department
Admission: EM | Admit: 2022-08-21 | Discharge: 2022-08-21 | Disposition: A | Discharge status: Home / Self Care | Payer: Medicare Other | Attending: Student in an Organized Health Care Education/Training Program | Admitting: Student in an Organized Health Care Education/Training Program

## 2022-08-21 DIAGNOSIS — R519 Headache, unspecified: Secondary | ICD-10-CM | POA: Insufficient documentation

## 2022-08-21 DIAGNOSIS — S3992XA Unspecified injury of lower back, initial encounter: Secondary | ICD-10-CM | POA: Diagnosis present

## 2022-08-21 DIAGNOSIS — S39012A Strain of muscle, fascia and tendon of lower back, initial encounter: Secondary | ICD-10-CM | POA: Diagnosis not present

## 2022-08-21 DIAGNOSIS — W19XXXA Unspecified fall, initial encounter: Secondary | ICD-10-CM

## 2022-08-21 DIAGNOSIS — Y92002 Bathroom of unspecified non-institutional (private) residence single-family (private) house as the place of occurrence of the external cause: Secondary | ICD-10-CM | POA: Insufficient documentation

## 2022-08-21 DIAGNOSIS — R109 Unspecified abdominal pain: Secondary | ICD-10-CM | POA: Diagnosis not present

## 2022-08-21 DIAGNOSIS — I10 Essential (primary) hypertension: Secondary | ICD-10-CM | POA: Insufficient documentation

## 2022-08-21 DIAGNOSIS — W01198A Fall on same level from slipping, tripping and stumbling with subsequent striking against other object, initial encounter: Secondary | ICD-10-CM | POA: Diagnosis not present

## 2022-08-21 LAB — URINALYSIS, ROUTINE W REFLEX MICROSCOPIC
Bacteria, UA: NONE SEEN
Bilirubin Urine: NEGATIVE
Glucose, UA: NEGATIVE mg/dL
Ketones, ur: 5 mg/dL — AB
Leukocytes,Ua: NEGATIVE
Nitrite: NEGATIVE
Protein, ur: 30 mg/dL — AB
Specific Gravity, Urine: 1.021 (ref 1.005–1.030)
Squamous Epithelial / HPF: NONE SEEN (ref 0–5)
pH: 5 (ref 5.0–8.0)

## 2022-08-21 MED ORDER — LIDOCAINE 5 % EX PTCH: 1.0000 | MEDICATED_PATCH | Freq: Two times a day (BID) | CUTANEOUS | 0 refills | Status: AC

## 2022-08-21 MED ORDER — METHOCARBAMOL 500 MG PO TABS: 500.0000 mg | ORAL_TABLET | Freq: Three times a day (TID) | ORAL | 0 refills | Status: AC

## 2022-08-21 NOTE — ED Triage Notes (Signed)
Pt here with back pain. Pt states he fell 4 days ago, hx of a stroke. Pt states his only pain is in his back.

## 2022-08-21 NOTE — ED Triage Notes (Signed)
First Nurse Note:  Arrives via [facility] from home.  Fall one week ago in bathroom.   C/O right flank and back pain since fall.  Does not recall hitting anything with fall.  This morning pain worse.  Per report, patient has been sleeping in chair since fall due to pain.  EMS states lung sounds diminished to right lower lobe.  147/92 86 P 20  RR 98% on RA 25 - CO2  No bruising to area, pain with palpation per EMS

## 2022-08-21 NOTE — ED Notes (Signed)
Pt and family give verbal consent to DC

## 2022-08-21 NOTE — Discharge Instructions (Addendum)
-  You may continue to treat the pain with Tylenol/ibuprofen as needed.  You may take methocarbamol, though use caution as it may make you drowsy or dizzy.  -Please follow-up with your primary care provider, as discussed.  -You may follow-up with the vascular surgeon listed in these instructions in regards to your abdominal aortic aneurysm.  -Return to the emergency department anytime if you begin to experience any new or worsening symptoms.

## 2022-08-21 NOTE — ED Notes (Signed)
See triage note  Presents with h/a and back pain  States he fell hitting his head and mid back about 1 weeks or so    States he has been unable to sleep d/t h/a

## 2022-08-21 NOTE — ED Provider Notes (Signed)
[facility] Provider Note    Event Date/Time   First MD Initiated Contact with Patient 08/21/22 1205     (approximate)   History   Chief Complaint Back Pain   HPI Jerome Wolfe is a 76 y.o. 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  Patient states that his pain started when he had a mechanical fall 4 days ago in the bathroom.  He states that he fell backwards onto his back/right side.  Since then he has had significant pain in his lower lumbar region and pain along the right side of his chest/flank.  Denies fever/chills, shortness of breath, abdominal pain, nausea/vomiting, headache, vision changes, rash/lesions, numbness/tingling upper or lower extremities, vertigo, or lightheadedness.  He is currently on Plavix, otherwise no blood thinners.  History Limitations: No limitations.        Physical Exam  Triage Vital Signs: ED Triage Vitals  Enc Vitals Group     BP 08/21/22 1144 (!) 181/101     Pulse Rate 08/21/22 1144 90     Resp 08/21/22 1144 20     Temp 08/21/22 1144 98 F (36.7 C)     Temp Source 08/21/22 1144 Oral     SpO2 08/21/22 1144 93 %     Weight 08/21/22 1217 177 lb 14.6 oz (80.7 kg)     Height 08/21/22 1217 6\' 2"  (1.88 m)     Head Circumference --      Peak Flow --      Pain Score 08/21/22 1144 6     Pain Loc --      Pain Edu? --      Excl. in GC? --     Most recent vital signs: Vitals:   08/21/22 1144 08/21/22 1427  BP: (!) 181/101 (!) 174/96  Pulse: 90 94  Resp: 20   Temp: 98 F (36.7 C) 98.2 F (36.8 C)  SpO2: 93% 96%    General: Awake, NAD.  Skin: Warm, dry. No rashes or lesions.  Eyes: PERRL. Conjunctivae normal.  CV: Good peripheral perfusion.  Resp: Normal effort.  Mild wheezing appreciated in both lungs bilaterally. Abd: Soft, non-tender. No distention.  Neuro: At baseline. No gross neurological deficits.   Focused Exam: No  cervical spine tenderness.  Tightness appreciated in the lumbar paraspinal muscles.  Baseline range of motion present in both upper and lower extremities.  PMS intact distally in all extremities.   Physical Exam    ED Results / Procedures / Treatments  Labs (all labs ordered are listed, but only abnormal results are displayed) Labs Reviewed  URINALYSIS, ROUTINE W REFLEX MICROSCOPIC - Abnormal; Notable for the following components:      Result Value   Color, Urine YELLOW (*)    APPearance CLEAR (*)    Hgb urine dipstick SMALL (*)    Ketones, ur 5 (*)    Protein, ur 30 (*)    All other components within normal limits     EKG N/A.   RADIOLOGY  ED Provider Interpretation: I personally reviewed and interpreted these images.  CT lumbar spine does not show any acute injuries to the cervical spine, but does show evidence of abdominal aortic aneurysm.  Head CT shows no acute intracranial abnormalities.  CT cervical spine negative.  Chest x-ray shows no acute findings.  CT Lumbar Spine Wo Contrast  Result Date: 08/21/2022 CLINICAL DATA:  Back trauma with no prior  imaging. Back injury 1 week ago EXAM: CT LUMBAR SPINE WITHOUT CONTRAST TECHNIQUE: Multidetector CT imaging of the lumbar spine was performed without intravenous contrast administration. Multiplanar CT image reconstructions were also generated. RADIATION DOSE REDUCTION: This exam was performed according to the departmental dose-optimization program which includes automated exposure control, adjustment of the mA and/or kV according to patient size and/or use of iterative reconstruction technique. COMPARISON:  None Available. FINDINGS: Segmentation: 5 lumbar type vertebrae. Alignment: No traumatic malalignment.  Mild dextroscoliosis Vertebrae: No acute fracture or incidental bone lesion. Paraspinal and other soft tissues: Negative for perispinal hemorrhage or mass. Infrarenal abdominal aortic aneurysm measuring at least 4.9 cm in  diameter. No aneurysm draping or retroperitoneal hematoma. Disc levels: T12- L1: Unremarkable. L1-L2: Mild disc narrowing L2-L3: Mild disc narrowing and endplate spurring V7-Q4: Mild disc narrowing with right eccentric endplate and facet spurring L4-L5: Disc space narrowing and bulging with bilateral facet spurring. Moderate bilateral foraminal narrowing L5-S1:Disc narrowing and bulging with endplate and facet spurring eccentric to the right where there is moderate foraminal stenosis. IMPRESSION: 1. Abdominal aortic aneurysm measuring up to 4.9 cm. Recommend follow-up CT/MR every 6 months and vascular consultation. This recommendation follows ACR consensus guidelines: White Paper of the ACR Incidental Findings Committee II on Vascular Findings. J Am Coll Radiol 2013; 10:789-794. 2. Negative for lumbar spine fracture or traumatic malalignment. 3. Lumbar spine degeneration and mild scoliosis. Moderate foraminal narrowings bilaterally at L4-5 and on the left right at L5-S1. Electronically Signed   By: Tiburcio Pea M.D.   On: 08/21/2022 13:29   DG Chest 2 View  Result Date: 08/21/2022 CLINICAL DATA:  Chest pain EXAM: CHEST - 2 VIEW COMPARISON:  08/22/2019 FINDINGS: Hyperinflated lungs with upper lobe emphysema. Fine interstitial pattern in the RIGHT lower lobe is unchanged from radiograph 2020. No focal consolidation. No pneumothorax. No acute osseous abnormality. IMPRESSION: 1. No acute cardiopulmonary findings. 2. Emphysematous change. 3. Chronic interstitial pattern in the RIGHT lower lobe. Electronically Signed   By: Genevive Bi M.D.   On: 08/21/2022 13:06   CT HEAD WO CONTRAST ( )  Result Date: 08/21/2022 CLINICAL DATA:  Neck trauma (Age >= 65y); fell and hit head in bathroom few days ago EXAM: CT HEAD WITHOUT CONTRAST TECHNIQUE: Contiguous axial images were obtained from the base of the skull through the vertex without intravenous contrast. RADIATION DOSE REDUCTION: This exam was performed  according to the departmental dose-optimization program which includes automated exposure control, adjustment of the mA and/or kV according to patient size and/or use of iterative reconstruction technique. COMPARISON:  08/22/2019 FINDINGS: Brain: No acute intracranial hemorrhage, mass effect, or edema. No new loss of gray-white differentiation. Prominence of the ventricles and sulci reflects parenchymal volume loss that has progressed since the prior study. Patchy and confluent areas of low-density in the supratentorial white matter are nonspecific but may reflect advanced chronic microvascular ischemic changes. No extra-axial collection. Vascular: There is intracranial atherosclerotic calcification at the skull base. Skull: Unremarkable. Sinuses/Orbits: No significant opacification. No acute orbital abnormality. Other: Mastoid air cells are clear. IMPRESSION: No evidence of acute intracranial injury. Progression of parenchymal volume loss since 2020. Advanced chronic microvascular ischemic changes. Electronically Signed   By: Guadlupe Spanish M.D.   On: 08/21/2022 12:32   CT Cervical Spine Wo Contrast  Result Date: 08/21/2022 CLINICAL DATA:  Neck trauma (Age >= 65y) EXAM: CT CERVICAL SPINE WITHOUT CONTRAST TECHNIQUE: Multidetector CT imaging of the cervical spine was performed without intravenous contrast. Multiplanar CT image reconstructions were also  generated. RADIATION DOSE REDUCTION: This exam was performed according to the departmental dose-optimization program which includes automated exposure control, adjustment of the mA and/or kV according to patient size and/or use of iterative reconstruction technique. COMPARISON:  None Available. FINDINGS: Alignment: Retrolisthesis at C4-C5. Skull base and vertebrae: Mild degenerative endplate irregularity. No acute fracture. Soft tissues and spinal canal: No prevertebral fluid or swelling. No visible canal hematoma. Disc levels: Multilevel degenerative changes are  present including disc space narrowing, endplate osteophytes, and facet and uncovertebral hypertrophy. Canal stenosis is greatest at C5-C6. Upper chest: Emphysema. Other: Calcified plaque at the common carotid bifurcations. IMPRESSION: No acute cervical spine fracture. Electronically Signed   By: Guadlupe Spanish M.D.   On: 08/21/2022 12:23    PROCEDURES:  Critical Care performed: N/A.  Procedures    MEDICATIONS ORDERED IN ED: Medications - No data to display   IMPRESSION / MDM / ASSESSMENT AND PLAN / ED COURSE  I reviewed the triage vital signs and the nursing notes.                              Differential diagnosis includes, but is not limited to, lumbar spine fracture, lumbosacral strain, epidural subdural hematoma, concussion, rib fractures, pneumothorax.   ED Course Patient appears well, notably hypertensive at 181/101, otherwise normal vitals.  Urinalysis shows small amounts of proteinuria ketonuria, otherwise no evidence of infection.  Assessment/Plan Patient presents with back pain and right-sided chest pain x4 days following mechanical fall.  Imaging has been reassuring for rule out of any significant traumatic findings, though CT lumbar does incidentally show abdominal aortic aneurysm.  Patient states that he is aware of this and knows that he must get CT scans every 6 months.  In regards to his chest pain, there is no evidence of any fractures or pneumothorax.  Likely chest wall contusion versus subtle rib fracture.  His back pain likely due to lumbar strain.  We will provide him with a prescription for methocarbamol to help manage his symptoms, I did advise him to be cautious as it may make him drowsy/dizzy and put him at increased risk of falling.  Patient expressed understanding.  Additionally provide him with lidocaine patches.  Encouraged him to continue taking Tylenol/ibuprofen as needed.  Will discharge.  Provided the patient with anticipatory guidance, return  precautions, and educational material. Encouraged the patient to return to the emergency department at any time if they begin to experience any new or worsening symptoms. Patient expressed understanding and agreed with the plan.   Patient's presentation is most consistent with acute complicated illness / injury requiring diagnostic workup.       FINAL CLINICAL IMPRESSION(S) / ED DIAGNOSES   Final diagnoses:  Strain of lumbar region, initial encounter     Rx / DC Orders   ED Discharge Orders          Ordered    methocarbamol (ROBAXIN) 500 MG tablet  3 times daily        08/21/22 1349    lidocaine (LIDODERM) 5 %  Every 12 hours        08/21/22 1349             Note:  This document was prepared using Dragon voice recognition software and may include unintentional dictation errors.   Varney Daily, [place] 08/21/22 1637    Willy Eddy, MD 08/22/22 [phone]

## 2022-08-29 ENCOUNTER — Other Ambulatory Visit: Payer: Self-pay

## 2022-08-29 ENCOUNTER — Emergency Department
Admission: EM | Admit: 2022-08-29 | Discharge: 2022-08-29 | Disposition: A | Discharge status: Home / Self Care | Payer: Medicare Other | Attending: Emergency Medicine | Admitting: Emergency Medicine

## 2022-08-29 DIAGNOSIS — R197 Diarrhea, unspecified: Secondary | ICD-10-CM | POA: Diagnosis present

## 2022-08-29 DIAGNOSIS — R109 Unspecified abdominal pain: Secondary | ICD-10-CM | POA: Insufficient documentation

## 2022-08-29 DIAGNOSIS — G20 Parkinson's disease: Secondary | ICD-10-CM | POA: Insufficient documentation

## 2022-08-29 DIAGNOSIS — I10 Essential (primary) hypertension: Secondary | ICD-10-CM | POA: Insufficient documentation

## 2022-08-29 HISTORY — DX: Parkinson's disease without dyskinesia, without mention of fluctuations: G20.A1

## 2022-08-29 LAB — COMPREHENSIVE METABOLIC PANEL
ALT: 16 U/L (ref 0–44)
AST: 24 U/L (ref 15–41)
Albumin: 4.4 g/dL (ref 3.5–5.0)
Alkaline Phosphatase: 65 U/L (ref 38–126)
Anion gap: 9 (ref 5–15)
BUN: 12 mg/dL (ref 8–23)
CO2: 25 mmol/L (ref 22–32)
Calcium: 9.6 mg/dL (ref 8.9–10.3)
Chloride: 106 mmol/L (ref 98–111)
Creatinine, Ser: 1.13 mg/dL (ref 0.61–1.24)
GFR, Estimated: >60 mL/min (ref >60)
Glucose, Bld: 110 mg/dL — ABNORMAL HIGH (ref 70–99)
Potassium: 3.8 mmol/L (ref 3.5–5.1)
Sodium: 140 mmol/L (ref 135–145)
Total Bilirubin: 1 mg/dL (ref 0.3–1.2)
Total Protein: 7.9 g/dL (ref 6.5–8.1)

## 2022-08-29 LAB — CBC WITH DIFFERENTIAL/PLATELET
Abs Immature Granulocytes: 0.03 10*3/uL (ref 0.00–0.07)
Basophils Absolute: 0 10*3/uL (ref 0.0–0.1)
Basophils Relative: 1 %
Eosinophils Absolute: 0 10*3/uL (ref 0.0–0.5)
Eosinophils Relative: 0 %
HCT: 40.7 % (ref 39.0–52.0)
Hemoglobin: 12.7 g/dL — ABNORMAL LOW (ref 13.0–17.0)
Immature Granulocytes: 0 %
Lymphocytes Relative: 28 %
Lymphs Abs: 2 10*3/uL (ref 0.7–4.0)
MCH: 21.6 pg — ABNORMAL LOW (ref 26.0–34.0)
MCHC: 31.2 g/dL (ref 30.0–36.0)
MCV: 69.2 fL — ABNORMAL LOW (ref 80.0–100.0)
Monocytes Absolute: 0.5 10*3/uL (ref 0.1–1.0)
Monocytes Relative: 8 %
Neutro Abs: 4.6 10*3/uL (ref 1.7–7.7)
Neutrophils Relative %: 63 %
Platelets: 235 10*3/uL (ref 150–400)
RBC: 5.88 MIL/uL — ABNORMAL HIGH (ref 4.22–5.81)
RDW: 15.9 % — ABNORMAL HIGH (ref 11.5–15.5)
WBC: 7.2 10*3/uL (ref 4.0–10.5)
nRBC: 0 % (ref 0.0–0.2)

## 2022-08-29 MED ORDER — LACTATED RINGERS IV BOLUS
1000.0000 mL | Freq: Once | INTRAVENOUS | Status: AC
  Administered 2022-08-29: 1000 mL via INTRAVENOUS

## 2022-08-29 NOTE — ED Notes (Signed)
Pt and family give verbal consent to DC 

## 2022-08-29 NOTE — ED Notes (Signed)
First Nurse Note: Pt to ED via [facility] from home for diarrhea. Pt has hx/o CVA and Parkinson's.   Decline in health for the past few months, family cannot take care of him anymore and would like to look into placement for him. Pt is currently in NAD. VSS.

## 2022-08-29 NOTE — ED Triage Notes (Signed)
Pt was constipated and took a tablet to ease the constipation. Pt now has diarrhea.

## 2022-08-29 NOTE — ED Provider Notes (Signed)
[facility] Provider Note    Event Date/Time   First MD Initiated Contact with Patient 08/29/22 1801     (approximate)   History   Diarrhea   HPI  Jerome Wolfe is a 76 y.o. male  with pmh HTN, Parkinson' disease who p/w diarrhea.  Patient was feeling constipated and then took a dose of MiraLAX on Monday.  Over the last 2 days he has had multiple episodes of loose stool.  He has difficulty quantifying as he does have some fecal incontinence at baseline but thinks about 3 episodes per day.  Nonbloody he has some mild abdominal cramping but really no significant pain denies fevers does feel somewhat weaker than normal.  Has been eating and drinking but not as much as normal denies urinary symptoms denies chest pain shortness of breath.     Past Medical History:  Diagnosis Date   Hypertension    Parkinson's disease [facility])     Patient Active Problem List   Diagnosis Date Noted   Stroke (cerebrum) (HCC) 08/23/2019   Slurred speech 08/22/2019     Physical Exam  Triage Vital Signs: ED Triage Vitals  Enc Vitals Group     BP 08/29/22 1702 (!) 154/78     Pulse Rate 08/29/22 1702 80     Resp 08/29/22 1702 17     Temp 08/29/22 1702 98.4 F (36.9 C)     Temp Source 08/29/22 1702 Oral     SpO2 08/29/22 1702 98 %     Weight 08/29/22 1650 176 lb 9.4 oz (80.1 kg)     Height 08/29/22 1650 6\' 2"  (1.88 m)     Head Circumference --      Peak Flow --      Pain Score 08/29/22 1650 0     Pain Loc --      Pain Edu? --      Excl. in GC? --     Most recent vital signs: Vitals:   08/29/22 1702  BP: (!) 154/78  Pulse: 80  Resp: 17  Temp: 98.4 F (36.9 C)  SpO2: 98%     General: Awake, no distress.  CV:  Good peripheral perfusion.  Resp:  Normal effort.  Abd:  No distention.  Abdomen is soft and nontender throughout Neuro:             Awake, Alert, Oriented x 3  Other:     ED Results / Procedures / Treatments  Labs (all labs ordered are listed, but  only abnormal results are displayed) Labs Reviewed  COMPREHENSIVE METABOLIC PANEL - Abnormal; Notable for the following components:      Result Value   Glucose, Bld 110 (*)    All other components within normal limits  CBC WITH DIFFERENTIAL/PLATELET - Abnormal; Notable for the following components:   RBC 5.88 (*)    Hemoglobin 12.7 (*)    MCV 69.2 (*)    MCH 21.6 (*)    RDW 15.9 (*)    All other components within normal limits  C DIFFICILE QUICK SCREEN W PCR REFLEX    GASTROINTESTINAL PANEL BY PCR, STOOL (REPLACES STOOL CULTURE)     EKG     RADIOLOGY    PROCEDURES:  Critical Care performed: No  Procedures   MEDICATIONS ORDERED IN ED: Medications  lactated ringers bolus 1,000 mL (1,000 mLs Intravenous New Bag/Given 08/29/22 1840)     IMPRESSION / MDM / ASSESSMENT AND PLAN / ED COURSE  I reviewed  the triage vital signs and the nursing notes.                              Patient's presentation is most consistent with acute complicated illness / injury requiring diagnostic workup.  Differential diagnosis includes, but is not limited to, infectious diarrhea including viral or bacterial, medication side effect, electrolyte abnormality, AKI  Patient is a 76 year old male with history of Parkinson's presents because of diarrhea he was initially feeling constipated and did take 1 dose of MiraLAX and over the last 2 days has now had diarrhea about 3 episodes per day but somewhat difficult to quantify as he is incontinent.  He has no blood in his stool no fevers no recent antibiotics.  Patient had been started on methocarbamol on 8/22 after was seen in the ED for back pain.  He tells me he tends towards constipation and will take MiraLAX as needed usually does not cause this amount of diarrhea no other sick contacts.  Patient's vitals are reassuring overall he looks nontoxic his abdomen is benign.  Because he is feeling generally weak we will check labs including CBC CMP.  Will  order stool studies if patient is able to provide a sample.  Could be related to the MiraLAX however would not expect 1 dose of MiraLAX to cause this degree of diarrhea especially since he has taken it in the past.    CBC and CMP are reassuring.  No AKI or electrolyte abnormality.  No leukocytosis, hemoglobin is stable.  Patient unable to provide stool sample in the ED.  Do not feel that antibiotics are needed at this time given no fever or bloody stool only 3 episodes per day.  Low suspicion for acute abdomen given benign exam and minimal abdominal pain.  Did discuss with patient's family that if he is having ongoing diarrhea in several days that they should see the primary doctor may need to have stool studies done at that time.  Recommended high-fiber diet.  Gust return precautions for fever abdominal pain bloody stool.  Patient is appropriate for discharge.   FINAL CLINICAL IMPRESSION(S) / ED DIAGNOSES   Final diagnoses:  Diarrhea, unspecified type     Rx / DC Orders   ED Discharge Orders     None        Note:  This document was prepared using Dragon voice recognition software and may include unintentional dictation errors.   Georga Hacking, MD 08/29/22 [phone]

## 2023-01-23 ENCOUNTER — Emergency Department: Payer: Medicare Other

## 2023-01-23 ENCOUNTER — Other Ambulatory Visit: Payer: Self-pay

## 2023-01-23 ENCOUNTER — Emergency Department
Admission: EM | Admit: 2023-01-23 | Discharge: 2023-01-23 | Disposition: A | Discharge status: Home / Self Care | Payer: Medicare Other | Attending: Emergency Medicine | Admitting: Emergency Medicine

## 2023-01-23 DIAGNOSIS — M25551 Pain in right hip: Secondary | ICD-10-CM | POA: Insufficient documentation

## 2023-01-23 DIAGNOSIS — S0990XA Unspecified injury of head, initial encounter: Secondary | ICD-10-CM | POA: Diagnosis not present

## 2023-01-23 DIAGNOSIS — W19XXXA Unspecified fall, initial encounter: Secondary | ICD-10-CM

## 2023-01-23 DIAGNOSIS — W010XXA Fall on same level from slipping, tripping and stumbling without subsequent striking against object, initial encounter: Secondary | ICD-10-CM | POA: Insufficient documentation

## 2023-01-23 LAB — CBC
HCT: 41.5 % (ref 39.0–52.0)
Hemoglobin: 13.2 g/dL (ref 13.0–17.0)
MCH: 22.1 pg — ABNORMAL LOW (ref 26.0–34.0)
MCHC: 31.8 g/dL (ref 30.0–36.0)
MCV: 69.4 fL — ABNORMAL LOW (ref 80.0–100.0)
Platelets: 247 10*3/uL (ref 150–400)
RBC: 5.98 MIL/uL — ABNORMAL HIGH (ref 4.22–5.81)
RDW: 16.3 % — ABNORMAL HIGH (ref 11.5–15.5)
WBC: 5.5 10*3/uL (ref 4.0–10.5)
nRBC: 0 % (ref 0.0–0.2)

## 2023-01-23 LAB — URINALYSIS, ROUTINE W REFLEX MICROSCOPIC
Bilirubin Urine: NEGATIVE
Glucose, UA: NEGATIVE mg/dL
Ketones, ur: 20 mg/dL — AB
Leukocytes,Ua: NEGATIVE
Nitrite: NEGATIVE
Protein, ur: 30 mg/dL — AB
Specific Gravity, Urine: 1.026 (ref 1.005–1.030)
pH: 5 (ref 5.0–8.0)

## 2023-01-23 LAB — COMPREHENSIVE METABOLIC PANEL
ALT: 15 U/L (ref 0–44)
AST: 33 U/L (ref 15–41)
Albumin: 4 g/dL (ref 3.5–5.0)
Alkaline Phosphatase: 55 U/L (ref 38–126)
Anion gap: 8 (ref 5–15)
BUN: 22 mg/dL (ref 8–23)
CO2: 24 mmol/L (ref 22–32)
Calcium: 9 mg/dL (ref 8.9–10.3)
Chloride: 109 mmol/L (ref 98–111)
Creatinine, Ser: 1.01 mg/dL (ref 0.61–1.24)
GFR, Estimated: >60 mL/min (ref >60)
Glucose, Bld: 87 mg/dL (ref 70–99)
Potassium: 3.7 mmol/L (ref 3.5–5.1)
Sodium: 141 mmol/L (ref 135–145)
Total Bilirubin: 1 mg/dL (ref 0.3–1.2)
Total Protein: 7.4 g/dL (ref 6.5–8.1)

## 2023-01-23 MED ORDER — SODIUM CHLORIDE 0.9 % IV SOLN: Freq: Once | INTRAVENOUS | Status: AC

## 2023-01-23 NOTE — ED Triage Notes (Signed)
Pt comes via EMS with c/o trip and fall. Pt states pain to left hip. Pt has bruise to hip too. VSS  Pt also possible UTI with strong urine odor.   Pt is A*OX4 Pt has hx of stroke and parkinson.

## 2023-01-23 NOTE — ED Provider Notes (Signed)
[facility] Provider Note    Event Date/Time   First MD Initiated Contact with Patient 01/23/23 1527     (approximate)   History   Fall   HPI  Kensley Lares is a 77 y.o. male who presents after a fall, complains of some mild right hip pain.  Patient reports she has had several falls over the last 1 to 2 weeks.  Daughter reports that she plans to take him to the [place] to see if he can have additional assistance at home but wanted to bring him by here to make sure he did not have a urinary tract infection or any significant injuries     Physical Exam   Triage Vital Signs: ED Triage Vitals [01/23/23 1141]  Enc Vitals Group     BP (!) 129/92     Pulse Rate 75     Resp 18     Temp 98.6 F (37 C)     Temp src      SpO2 97 %     Weight      Height      Head Circumference      Peak Flow      Pain Score 6     Pain Loc      Pain Edu?      Excl. in [place]?     Most recent vital signs: Vitals:   01/23/23 1141  BP: (!) 129/92  Pulse: 75  Resp: 18  Temp: 98.6 F (37 C)  SpO2: 97%     General: Awake, no distress.  CV:  Good peripheral perfusion.  Resp:  Normal effort.  Abd:  No distention.  Other:  No pain with axial load on both hips, no vertebral arms palpation, no chest wall tenderness, no abdominal tenderness to palpation, no evidence of facial injury   ED Results / Procedures / Treatments   Labs (all labs ordered are listed, but only abnormal results are displayed) Labs Reviewed  URINALYSIS, ROUTINE W REFLEX MICROSCOPIC - Abnormal; Notable for the following components:      Result Value   Color, Urine YELLOW (*)    APPearance CLEAR (*)    Hgb urine dipstick SMALL (*)    Ketones, ur 20 (*)    Protein, ur 30 (*)    Bacteria, UA RARE (*)    All other components within normal limits  CBC - Abnormal; Notable for the following components:   RBC 5.98 (*)    MCV 69.4 (*)    MCH 22.1 (*)    RDW 16.3 (*)    All other components within normal  limits  COMPREHENSIVE METABOLIC PANEL     EKG     RADIOLOGY  CT cervical spine and CT head unremarkable Hip x-ray viewed interpret by me, no fracture   PROCEDURES:  Critical Care performed:   Procedures   MEDICATIONS ORDERED IN ED: Medications  0.9 %  sodium chloride infusion (0 mLs Intravenous Stopped 01/23/23 1730)     IMPRESSION / MDM / ASSESSMENT AND PLAN / ED COURSE  I reviewed the triage vital signs and the nursing notes. Patient's presentation is most consistent with acute presentation with potential threat to life or bodily function.  Patient presents after falls as described above.  Imaging is reassuring, lab work obtained which is overall unremarkable.  No evidence of urinary tract infection, no indication for admission at this time, appropriate for discharge with close outpatient follow-up  FINAL CLINICAL IMPRESSION(S) / ED DIAGNOSES   Final diagnoses:  Fall, initial encounter     Rx / DC Orders   ED Discharge Orders     None        Note:  This document was prepared using Dragon voice recognition software and may include unintentional dictation errors.   Lavonia Drafts, MD 01/23/23 [phone]

## 2023-01-23 NOTE — ED Provider Triage Note (Signed)
Emergency Medicine Provider Triage Evaluation Note  Jerome Wolfe , a 77 y.o. male  was evaluated in triage.  Pt complains of left hip pain after a trip and fall today. Reports was on the round for 30 min before EMS came  Review of Systems  Positive: Hip pain Negative: HS, LOC  Physical Exam  There were no vitals taken for this visit. Gen:   Awake, no distress   Resp:  Normal effort  MSK:   Moves extremities without difficulty  Other:   Medical Decision Making  Medically screening exam initiated at 11:39 AM.  Appropriate orders placed.  Tennessee Perra was informed that the remainder of the evaluation will be completed by another provider, this initial triage assessment does not replace that evaluation, and the importance of remaining in the ED until their evaluation is complete.     Marquette Old, PA-C 01/23/23 1142

## 2023-02-06 ENCOUNTER — Ambulatory Visit
Admission: EM | Admit: 2023-02-06 | Discharge: 2023-02-06 | Disposition: A | Discharge status: Home / Self Care | Payer: Medicare Other | Attending: Family Medicine | Admitting: Family Medicine

## 2023-02-06 ENCOUNTER — Ambulatory Visit (INDEPENDENT_AMBULATORY_CARE_PROVIDER_SITE_OTHER): Payer: Medicare Other

## 2023-02-06 DIAGNOSIS — W19XXXA Unspecified fall, initial encounter: Secondary | ICD-10-CM

## 2023-02-06 DIAGNOSIS — S2241XA Multiple fractures of ribs, right side, initial encounter for closed fracture: Secondary | ICD-10-CM

## 2023-02-06 DIAGNOSIS — S42031A Displaced fracture of lateral end of right clavicle, initial encounter for closed fracture: Secondary | ICD-10-CM

## 2023-02-06 MED ORDER — HYDROCODONE-ACETAMINOPHEN 5-325 MG PO TABS: 2.0000 | ORAL_TABLET | ORAL | 0 refills | Status: DC | PRN

## 2023-02-06 NOTE — Discharge Instructions (Addendum)
You have multiple fractures involving right eighth through eleventh ribs AND a distal right clavicular fracture.  Follow up with [facility] on Monday (02/11/23). Stop by the pharmacy to pick up your pain medication.   It is important that you take deep breaths several times a hour to prevent pneumonia.   Use your rollator or walker with movement to avoid falling.

## 2023-02-06 NOTE — ED Triage Notes (Signed)
Pt presents s/p fall one week ago when lost balance getting out of bed. Initially thought it was only head/neck. Has developed R shoulder and R lower rib pain. R clavicle(?) disfigured, wife states it was not there prior to fall. Denies numbness/tingling in RUE. Ribs very tender to palpation.

## 2023-02-06 NOTE — ED Provider Notes (Signed)
[facility]    CSN: [id number] Arrival date & time: 02/06/23  1246      History   Chief Complaint Chief Complaint  Patient presents with   Shoulder Pain   Rib Injury    HPI  HPI Jerome Wolfe is a 77 y.o. male.   Korin brought in by wife for right shoulder pain after a fall about a week ago.  Patient states he was trying to get out of bed and that he fell and hit his right shoulder on the bed rail.  He does not recall any abnormal pops or sounds.  He has been having persistent pain since.  He has been taking Tylenol twice a day.  Someone came out to the house to check on him and noticed that his shoulder looked abnormal.  He was advised to go get this checked out so they came to the urgent care.  Additionally, he has some right-sided rib pain.  He has not had a cough or fever.  Wife notes that his history of Parkinson's and has been using his rollator to get around as they no longer have a walker at home.  Patient follows with the [facility].  He is right-handed.     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 Authorizing Provider  albuterol (VENTOLIN HFA) 108 (90 Base) MCG/ACT inhaler Inhale 2 puffs into the lungs every 4 (four) hours as needed for wheezing. 07/21/19  Yes [provider]  aspirin EC 81 MG EC tablet Take 1 tablet (81 mg total) by mouth daily. 08/25/19  Yes Gladstone Lighter, MD  HYDROcodone-acetaminophen (NORCO/VICODIN) 5-325 MG tablet Take 2 tablets by mouth every 4 (four) hours as needed. 02/06/23  Yes Kittie Krizan, DO  mometasone-formoterol (DULERA) 100-5 MCG/ACT AERO Inhale 2 puffs into  the lungs 2 (two) times daily.   Yes [provider]  tamsulosin (FLOMAX) 0.4 MG CAPS capsule Take 0.4 mg by mouth daily after breakfast.   Yes [provider]  amLODipine (NORVASC) 5 MG tablet Take 1 tablet (5 mg total) by mouth daily. 08/24/19   Gladstone Lighter, MD  atorvastatin (LIPITOR) 80 MG tablet Take 1 tablet (80 mg total) by mouth daily at 6 PM. 08/24/19   Gladstone Lighter, MD  budesonide-formoterol [facility]) 160-4.5 MCG/ACT inhaler Inhale 2 puffs into the lungs 2 (two) times daily.    [provider]  cholecalciferol (VITAMIN D3) 25 MCG (1000 UT) tablet Take 1,000 Units by mouth daily.    [provider]  clopidogrel (PLAVIX) 75 MG tablet Take 1 tablet (75 mg total) by mouth daily. For 3 weeks and stop 08/25/19   Gladstone Lighter, MD  dorzolamide (TRUSOPT) 2 % ophthalmic solution Place 1 drop into both eyes 3 (three) times daily.    [provider]  ferrous sulfate 325 (65 FE) MG tablet Take 1 tablet by mouth daily.    [provider]  fluticasone (FLONASE) 50 MCG/ACT nasal spray Place 2 sprays into both nostrils daily. 07/21/19   [provider]  latanoprost (XALATAN) 0.005 % ophthalmic  solution Place 1 drop into both eyes at bedtime.    [provider]  pyridOXINE (VITAMIN B-6) 100 MG tablet Take 100 mg by mouth daily.    [provider]  QUEtiapine (SEROQUEL) 25 MG tablet Take 12.5 mg by mouth at bedtime.  07/21/19 07/20/20  [provider]  selenium sulfide (SELSUN) 1 % LOTN Apply 1 application topically daily.    [provider]  sertraline (ZOLOFT) 50 MG tablet Take 75 mg by mouth daily. 07/21/19 07/20/20  [provider]  vitamin B-12 (CYANOCOBALAMIN) 100 MCG tablet Take 100 mcg by mouth daily.    [provider]    Family History Family History  Problem Relation Age of Onset   Dementia Mother    Heart attack Father 24    Social History Social History    Tobacco Use   Smoking status: Former    Types: Cigarettes    Quit date: 08/21/2010    Years since quitting: 12.4   Smokeless tobacco: Never  Substance Use Topics   Alcohol use: No   Drug use: No     Allergies   Patient has no known allergies.   Review of Systems Review of Systems: :negative unless otherwise stated in HPI.      Physical Exam Triage Vital Signs ED Triage Vitals  Enc Vitals Group     BP 02/06/23 1403 (!) 167/97     Pulse Rate 02/06/23 1403 75     Resp 02/06/23 1403 20     Temp 02/06/23 1403 97.7 F (36.5 C)     Temp Source 02/06/23 1403 Oral     SpO2 02/06/23 1403 96 %     Weight --      Height --      Head Circumference --      Peak Flow --      Pain Score 02/06/23 1355 5     Pain Loc --      Pain Edu? --      Excl. in GC? --    No data found.  Updated Vital Signs BP (!) 167/97 (BP Location: Right Arm)   Pulse 75   Temp 97.7 F (36.5 C) (Oral)   Resp 20   SpO2 96%   Visual Acuity Right Eye Distance:   Left Eye Distance:   Bilateral Distance:    Right Eye Near:   Left Eye Near:    Bilateral Near:     Physical Exam GEN: chronically ill- appearing elderly male  CVS: well perfused, radial pulses present,  RESP: speaking in full sentences without pause, no respiratory distress  MSK: Right shoulder:  Evidence of bony deformity, asymmetry on the right  No tenderness over long head of biceps (bicipital groove).  +TTP at [facility] joint with skin tenting  Limited ROM  Special Tests: not attempted  Sensation intact. Peripheral pulses intact.   UC Treatments / Results  Labs (all labs ordered are listed, but only abnormal results are displayed) Labs Reviewed - No data to display  EKG   Radiology DG Ribs Unilateral W/Chest Right  Result Date: 02/06/2023 CLINICAL DATA:  Fall, right shoulder and rib pain EXAM: RIGHT RIBS AND CHEST - 3+ VIEW COMPARISON:  08/21/2022 FINDINGS: Acute fractures involving the lateral right eighth, ninth, tenth,  and eleventh ribs. Minimal fracture offset although no significant displacement. Subacute or chronic appearing fracture of the posterolateral right eighth rib with callus formation. There is no evidence of pneumothorax or pleural effusion. Both lungs are clear.  Heart size and mediastinal contours are within normal limits. Right clavicular fracture, as described on dedicated shoulder x-rays. IMPRESSION: 1. Acute fractures involving the lateral right eighth through eleventh ribs. No pneumothorax. 2. Subacute or chronic appearing fracture of the posterolateral right eighth rib. 3. Acute distal right clavicular fracture. These results will be called to the ordering clinician or representative by the Radiologist Assistant, and communication documented in the PACS or [employer]. Electronically Signed   By: Davina Poke D.O.   On: 02/06/2023 14:49   DG Shoulder Right  Result Date: 02/06/2023 CLINICAL DATA:  Fall, right shoulder pain EXAM: RIGHT SHOULDER - 2+ VIEW COMPARISON:  None Available. FINDINGS: Acute fracture involving the distal third of the right clavicle. There is 1 shaft width of inferior displacement of the distal fracture component. Prominent superior apex angulation. Fracture does not extend intra-articularly to the [facility] joint. AC joint is intact. Glenohumeral joint intact without fracture or dislocation. Soft tissue swelling at the fracture site. IMPRESSION: Acute displaced and angulated fracture of the distal right clavicle. Electronically Signed   By: Davina Poke D.O.   On: 02/06/2023 14:46    Procedures Procedures (including critical care time)  Medications Ordered in UC Medications - No data to display  Initial Impression / Assessment and Plan / UC Course  I have reviewed the triage vital signs and the nursing notes.  Pertinent labs & imaging results that were available during my care of the patient were reviewed by me and considered in my medical decision making (see chart for  details).      Pt is a 77 y.o.  male with 1 week of right shoulder and rib pain after a fall at home while trying to get out of bed. Obtained right shoulder plain films and right rib series.  Personally reviewed by me were remarkable for displaced right clavicle fracture and several rib fractures. Radiologist notes acute rib fractures of 8-11th on the right and  no soft tissue swelling.  Given his angulated right distal clavicle fracture, I consulted with on-call provider for orthopedic surgery at [facility]  Dr. Sharlet Salina returned my phone call and I discussed the case with him and provided him with photos.  Dr. Sharlet Salina recommended patient follow-up outpatient as they will try to avoid surgery given his age and comorbidities.  He recommended a shoulder sling which was provided to the patient prior to discharge.  He is to follow-up with orthopedic surgery/[facility] in [place] next week.  Wife and patient updated with the plan and a male family member who was on the phone.  All questions asked were answered.  Prescribed Norco for pain relief.  Tylenol PRN for mild pain.  Counseled patient on red flag symptoms and when to seek immediate care.    Patient to follow up with orthopedic provider, if symptoms do not improve with conservative treatment.  Return and ED precautions given. Understanding voiced. Discussed MDM, treatment plan and plan for follow-up with patient who agrees with plan.   Final Clinical Impressions(s) / UC Diagnoses   Final diagnoses:  Closed fracture of multiple ribs of right side, initial encounter  Closed displaced fracture of acromial end of right clavicle, initial encounter  Fall, initial encounter     Discharge Instructions      You have multiple fractures involving right eighth through eleventh ribs AND a distal right clavicular fracture.  Follow up with [facility] on Monday (02/11/23). Stop by the pharmacy to pick up your pain medication.   It  is important that you  take deep breaths several times a hour to prevent pneumonia.   Use your rollator or walker with movement to avoid falling.       ED Prescriptions     Medication Sig Dispense Auth. Provider   HYDROcodone-acetaminophen (NORCO/VICODIN) 5-325 MG tablet Take 2 tablets by mouth every 4 (four) hours as needed. 10 tablet Lyndee Hensen, DO      I have reviewed the PDMP during this encounter.   Lyndee Hensen, DO 02/06/23 2359

## 2024-03-01 ENCOUNTER — Observation Stay
Admission: EM | Admit: 2024-03-01 | Discharge: 2024-03-03 | Disposition: A | Discharge status: Home / Self Care | Attending: Hospitalist | Admitting: Hospitalist

## 2024-03-01 DIAGNOSIS — R296 Repeated falls: Secondary | ICD-10-CM | POA: Diagnosis present

## 2024-03-01 DIAGNOSIS — G20A1 Parkinson's disease without dyskinesia, without mention of fluctuations: Secondary | ICD-10-CM

## 2024-03-01 DIAGNOSIS — W19XXXA Unspecified fall, initial encounter: Principal | ICD-10-CM

## 2024-03-01 DIAGNOSIS — Z7901 Long term (current) use of anticoagulants: Secondary | ICD-10-CM | POA: Diagnosis not present

## 2024-03-01 DIAGNOSIS — I69352 Hemiplegia and hemiparesis following cerebral infarction affecting left dominant side: Secondary | ICD-10-CM | POA: Diagnosis not present

## 2024-03-01 DIAGNOSIS — R6510 Systemic inflammatory response syndrome (SIRS) of non-infectious origin without acute organ dysfunction: Secondary | ICD-10-CM | POA: Diagnosis not present

## 2024-03-01 DIAGNOSIS — I714 Abdominal aortic aneurysm, without rupture, unspecified: Secondary | ICD-10-CM | POA: Diagnosis present

## 2024-03-01 DIAGNOSIS — R262 Difficulty in walking, not elsewhere classified: Secondary | ICD-10-CM

## 2024-03-01 DIAGNOSIS — N400 Enlarged prostate without lower urinary tract symptoms: Secondary | ICD-10-CM | POA: Diagnosis not present

## 2024-03-01 DIAGNOSIS — T699XXA Effect of reduced temperature, unspecified, initial encounter: Secondary | ICD-10-CM

## 2024-03-01 DIAGNOSIS — F0280 Dementia in other diseases classified elsewhere without behavioral disturbance: Secondary | ICD-10-CM

## 2024-03-01 DIAGNOSIS — I69359 Hemiplegia and hemiparesis following cerebral infarction affecting unspecified side: Secondary | ICD-10-CM

## 2024-03-01 DIAGNOSIS — E43 Unspecified severe protein-calorie malnutrition: Secondary | ICD-10-CM | POA: Insufficient documentation

## 2024-03-01 DIAGNOSIS — J441 Chronic obstructive pulmonary disease with (acute) exacerbation: Principal | ICD-10-CM | POA: Diagnosis present

## 2024-03-01 DIAGNOSIS — I10 Essential (primary) hypertension: Secondary | ICD-10-CM | POA: Insufficient documentation

## 2024-03-01 DIAGNOSIS — X31XXXA Exposure to excessive natural cold, initial encounter: Secondary | ICD-10-CM | POA: Diagnosis not present

## 2024-03-01 DIAGNOSIS — K5900 Constipation, unspecified: Secondary | ICD-10-CM | POA: Insufficient documentation

## 2024-03-01 DIAGNOSIS — Z87891 Personal history of nicotine dependence: Secondary | ICD-10-CM | POA: Insufficient documentation

## 2024-03-01 DIAGNOSIS — R531 Weakness: Secondary | ICD-10-CM

## 2024-03-01 DIAGNOSIS — J449 Chronic obstructive pulmonary disease, unspecified: Secondary | ICD-10-CM | POA: Diagnosis present

## 2024-03-01 DIAGNOSIS — E46 Unspecified protein-calorie malnutrition: Secondary | ICD-10-CM

## 2024-03-01 NOTE — ED Triage Notes (Signed)
 Per EMS, patient left home around 9:00pm, and returned about 1030pm. When he got out of his [name], he lost his balance and fell. He was outside for about an hour before he was able to get a hold of his wife. She then called EMS. Patient not C/O any pain or injuries at this time. Patient does have some underlying dementia but is alert and oriented at this time.

## 2024-03-02 ENCOUNTER — Emergency Department

## 2024-03-02 ENCOUNTER — Encounter: Payer: Self-pay | Admitting: Hospitalist

## 2024-03-02 ENCOUNTER — Other Ambulatory Visit: Payer: Self-pay

## 2024-03-02 DIAGNOSIS — G20A1 Parkinson's disease without dyskinesia, without mention of fluctuations: Secondary | ICD-10-CM

## 2024-03-02 DIAGNOSIS — E46 Unspecified protein-calorie malnutrition: Secondary | ICD-10-CM

## 2024-03-02 DIAGNOSIS — K5900 Constipation, unspecified: Secondary | ICD-10-CM | POA: Insufficient documentation

## 2024-03-02 DIAGNOSIS — N400 Enlarged prostate without lower urinary tract symptoms: Secondary | ICD-10-CM | POA: Insufficient documentation

## 2024-03-02 DIAGNOSIS — I69359 Hemiplegia and hemiparesis following cerebral infarction affecting unspecified side: Secondary | ICD-10-CM

## 2024-03-02 DIAGNOSIS — R6510 Systemic inflammatory response syndrome (SIRS) of non-infectious origin without acute organ dysfunction: Secondary | ICD-10-CM

## 2024-03-02 DIAGNOSIS — R296 Repeated falls: Secondary | ICD-10-CM | POA: Diagnosis present

## 2024-03-02 DIAGNOSIS — T699XXA Effect of reduced temperature, unspecified, initial encounter: Secondary | ICD-10-CM

## 2024-03-02 DIAGNOSIS — J441 Chronic obstructive pulmonary disease with (acute) exacerbation: Secondary | ICD-10-CM | POA: Diagnosis present

## 2024-03-02 DIAGNOSIS — I10 Essential (primary) hypertension: Secondary | ICD-10-CM | POA: Insufficient documentation

## 2024-03-02 LAB — COMPREHENSIVE METABOLIC PANEL
ALT: 20 U/L (ref 0–44)
AST: 32 U/L (ref 15–41)
Albumin: 3.7 g/dL (ref 3.5–5.0)
Alkaline Phosphatase: 53 U/L (ref 38–126)
Anion gap: 10 (ref 5–15)
BUN: 20 mg/dL (ref 8–23)
CO2: 21 mmol/L — ABNORMAL LOW (ref 22–32)
Calcium: 9.2 mg/dL (ref 8.9–10.3)
Chloride: 107 mmol/L (ref 98–111)
Creatinine, Ser: 1.13 mg/dL (ref 0.61–1.24)
GFR, Estimated: >60 mL/min (ref >60)
Glucose, Bld: 84 mg/dL (ref 70–99)
Potassium: 5 mmol/L (ref 3.5–5.1)
Sodium: 138 mmol/L (ref 135–145)
Total Bilirubin: 0.8 mg/dL (ref 0.0–1.2)
Total Protein: 6.9 g/dL (ref 6.5–8.1)

## 2024-03-02 LAB — CBC WITH DIFFERENTIAL/PLATELET
Abs Immature Granulocytes: 0.03 10*3/uL (ref 0.00–0.07)
Basophils Absolute: 0.1 10*3/uL (ref 0.0–0.1)
Basophils Relative: 1 %
Eosinophils Absolute: 0.1 10*3/uL (ref 0.0–0.5)
Eosinophils Relative: 1 %
HCT: 38.3 % — ABNORMAL LOW (ref 39.0–52.0)
Hemoglobin: 11.9 g/dL — ABNORMAL LOW (ref 13.0–17.0)
Immature Granulocytes: 0 %
Lymphocytes Relative: 20 %
Lymphs Abs: 1.6 10*3/uL (ref 0.7–4.0)
MCH: 22.6 pg — ABNORMAL LOW (ref 26.0–34.0)
MCHC: 31.1 g/dL (ref 30.0–36.0)
MCV: 72.8 fL — ABNORMAL LOW (ref 80.0–100.0)
Monocytes Absolute: 0.5 10*3/uL (ref 0.1–1.0)
Monocytes Relative: 7 %
Neutro Abs: 5.6 10*3/uL (ref 1.7–7.7)
Neutrophils Relative %: 71 %
Platelets: 174 10*3/uL (ref 150–400)
RBC: 5.26 MIL/uL (ref 4.22–5.81)
RDW: 16.9 % — ABNORMAL HIGH (ref 11.5–15.5)
WBC: 7.9 10*3/uL (ref 4.0–10.5)
nRBC: 0 % (ref 0.0–0.2)

## 2024-03-02 LAB — URINALYSIS, W/ REFLEX TO CULTURE (INFECTION SUSPECTED)
Bacteria, UA: NONE SEEN
Bilirubin Urine: NEGATIVE
Glucose, UA: NEGATIVE mg/dL
Ketones, ur: NEGATIVE mg/dL
Leukocytes,Ua: NEGATIVE
Nitrite: NEGATIVE
Protein, ur: 30 mg/dL — AB
Specific Gravity, Urine: 1.009 (ref 1.005–1.030)
Squamous Epithelial / HPF: 0 /HPF (ref 0–5)
pH: 7 (ref 5.0–8.0)

## 2024-03-02 LAB — CK: Total CK: 196 U/L (ref 49–397)

## 2024-03-02 LAB — LACTIC ACID, PLASMA: Lactic Acid, Venous: 1.1 mmol/L (ref 0.5–1.9)

## 2024-03-02 MED ORDER — QUETIAPINE FUMARATE 25 MG PO TABS
25.0000 mg | ORAL_TABLET | Freq: Every day | ORAL | Status: DC
  Administered 2024-03-02: 25 mg via ORAL
  Filled 2024-03-02: qty 1

## 2024-03-02 MED ORDER — FINASTERIDE 5 MG PO TABS
5.0000 mg | ORAL_TABLET | Freq: Every day | ORAL | Status: DC
  Administered 2024-03-02 – 2024-03-03 (×2): 5 mg via ORAL
  Filled 2024-03-02 (×2): qty 1

## 2024-03-02 MED ORDER — ACETAMINOPHEN 650 MG RE SUPP
650.0000 mg | Freq: Four times a day (QID) | RECTAL | Status: DC | PRN
Start: 2024-03-02 — End: 2024-03-03

## 2024-03-02 MED ORDER — SENNOSIDES-DOCUSATE SODIUM 8.6-50 MG PO TABS
1.0000 | ORAL_TABLET | Freq: Every day | ORAL | Status: DC
  Administered 2024-03-02: 1 via ORAL
  Filled 2024-03-02: qty 1

## 2024-03-02 MED ORDER — IPRATROPIUM-ALBUTEROL 0.5-2.5 (3) MG/3ML IN SOLN
3.0000 mL | Freq: Two times a day (BID) | RESPIRATORY_TRACT | Status: DC
  Administered 2024-03-03: 3 mL via RESPIRATORY_TRACT
  Filled 2024-03-02: qty 3

## 2024-03-02 MED ORDER — ATORVASTATIN CALCIUM 20 MG PO TABS
40.0000 mg | ORAL_TABLET | Freq: Every day | ORAL | Status: DC
  Administered 2024-03-03: 40 mg via ORAL
  Filled 2024-03-02 (×2): qty 2

## 2024-03-02 MED ORDER — IBUPROFEN 400 MG PO TABS
400.0000 mg | ORAL_TABLET | Freq: Once | ORAL | Status: AC
  Administered 2024-03-02: 400 mg via ORAL
  Filled 2024-03-02: qty 1

## 2024-03-02 MED ORDER — ACETAMINOPHEN 325 MG PO TABS: 650.0000 mg | ORAL_TABLET | Freq: Four times a day (QID) | ORAL | Status: DC | PRN

## 2024-03-02 MED ORDER — ENOXAPARIN SODIUM 40 MG/0.4ML IJ SOSY
40.0000 mg | PREFILLED_SYRINGE | INTRAMUSCULAR | Status: DC
Start: 2024-03-02 — End: 2024-03-03
  Administered 2024-03-02 – 2024-03-03 (×2): 40 mg via SUBCUTANEOUS
  Filled 2024-03-02 (×2): qty 0.4

## 2024-03-02 MED ORDER — ALBUTEROL SULFATE (2.5 MG/3ML) 0.083% IN NEBU: 2.5000 mg | INHALATION_SOLUTION | RESPIRATORY_TRACT | Status: DC | PRN

## 2024-03-02 MED ORDER — PREDNISONE 20 MG PO TABS: 40.0000 mg | ORAL_TABLET | Freq: Every day | ORAL | Status: DC

## 2024-03-02 MED ORDER — SODIUM CHLORIDE 0.9 % IV BOLUS
500.0000 mL | Freq: Once | INTRAVENOUS | Status: AC
  Administered 2024-03-02: 500 mL via INTRAVENOUS

## 2024-03-02 MED ORDER — METHYLPREDNISOLONE SODIUM SUCC 40 MG IJ SOLR
40.0000 mg | Freq: Two times a day (BID) | INTRAMUSCULAR | Status: AC
  Administered 2024-03-02 – 2024-03-03 (×2): 40 mg via INTRAVENOUS
  Filled 2024-03-02 (×2): qty 1

## 2024-03-02 MED ORDER — IPRATROPIUM-ALBUTEROL 0.5-2.5 (3) MG/3ML IN SOLN
3.0000 mL | Freq: Once | RESPIRATORY_TRACT | Status: AC
  Administered 2024-03-02: 3 mL via RESPIRATORY_TRACT
  Filled 2024-03-02: qty 9

## 2024-03-02 MED ORDER — OLANZAPINE 5 MG PO TABS
2.5000 mg | ORAL_TABLET | Freq: Every day | ORAL | Status: DC
  Administered 2024-03-03: 2.5 mg via ORAL
  Filled 2024-03-02 (×2): qty 1

## 2024-03-02 MED ORDER — AMANTADINE HCL 100 MG PO CAPS
100.0000 mg | ORAL_CAPSULE | Freq: Two times a day (BID) | ORAL | Status: DC
  Administered 2024-03-02 – 2024-03-03 (×3): 100 mg via ORAL
  Filled 2024-03-02 (×4): qty 1

## 2024-03-02 MED ORDER — LISINOPRIL 20 MG PO TABS
20.0000 mg | ORAL_TABLET | Freq: Every day | ORAL | Status: DC
  Administered 2024-03-03: 20 mg via ORAL
  Filled 2024-03-02: qty 2
  Filled 2024-03-02: qty 1

## 2024-03-02 MED ORDER — ASPIRIN 81 MG PO TBEC
81.0000 mg | DELAYED_RELEASE_TABLET | Freq: Every day | ORAL | Status: DC
  Administered 2024-03-03: 81 mg via ORAL
  Filled 2024-03-02 (×2): qty 1

## 2024-03-02 MED ORDER — ONDANSETRON HCL 4 MG/2ML IJ SOLN: 4.0000 mg | Freq: Four times a day (QID) | INTRAMUSCULAR | Status: DC | PRN

## 2024-03-02 MED ORDER — METHYLPREDNISOLONE SODIUM SUCC 40 MG IJ SOLR
40.0000 mg | Freq: Once | INTRAMUSCULAR | Status: AC
  Administered 2024-03-02: 40 mg via INTRAVENOUS
  Filled 2024-03-02: qty 1

## 2024-03-02 MED ORDER — SODIUM CHLORIDE 0.9 % IV BOLUS (SEPSIS)
500.0000 mL | Freq: Once | INTRAVENOUS | Status: AC
  Administered 2024-03-02: 500 mL via INTRAVENOUS

## 2024-03-02 MED ORDER — IPRATROPIUM-ALBUTEROL 0.5-2.5 (3) MG/3ML IN SOLN
3.0000 mL | Freq: Once | RESPIRATORY_TRACT | Status: AC
  Administered 2024-03-02: 3 mL via RESPIRATORY_TRACT
  Filled 2024-03-02: qty 6

## 2024-03-02 MED ORDER — IPRATROPIUM-ALBUTEROL 0.5-2.5 (3) MG/3ML IN SOLN
3.0000 mL | Freq: Four times a day (QID) | RESPIRATORY_TRACT | Status: DC
  Administered 2024-03-02 (×3): 3 mL via RESPIRATORY_TRACT
  Filled 2024-03-02 (×3): qty 3

## 2024-03-02 MED ORDER — ONDANSETRON HCL 4 MG PO TABS: 4.0000 mg | ORAL_TABLET | Freq: Four times a day (QID) | ORAL | Status: DC | PRN

## 2024-03-02 MED ORDER — TAMSULOSIN HCL 0.4 MG PO CAPS
0.4000 mg | ORAL_CAPSULE | Freq: Every day | ORAL | Status: DC
  Administered 2024-03-03: 0.4 mg via ORAL
  Filled 2024-03-02 (×2): qty 1

## 2024-03-02 NOTE — Assessment & Plan Note (Signed)
 Continue Proscar 5 mg daily and tamsulosin 0.4 mg daily

## 2024-03-02 NOTE — Assessment & Plan Note (Signed)
 Scheduled and as needed nebulizers IV steroids transition to oral Antitussives, supplemental O2 if needed

## 2024-03-02 NOTE — ED Notes (Signed)
 Pt incontinent of urine. Incontinence care provided, pt changed into gown, brief placed. Call bell within reach.

## 2024-03-02 NOTE — Assessment & Plan Note (Addendum)
 Protein calorie malnutrition, unspecified PT, TOC, dietary consults

## 2024-03-02 NOTE — Progress Notes (Signed)
 Spoke with daughter and she would like to be called with information and would like to talk about placement.  April biggs [phone]

## 2024-03-02 NOTE — Assessment & Plan Note (Addendum)
 Continue amantadine 100 mg daily, olanzapine 2.5 mg daily, quetiapine 25 mg nightly delirium precautions

## 2024-03-02 NOTE — Assessment & Plan Note (Signed)
 No acute issues suspected.  Last measured 4.9 cm in 2023

## 2024-03-02 NOTE — H&P (Signed)
 History and Physical    Patient: Jerome Wolfe ZOX:[id number] DOB: March 21, 1946 DOA: 03/01/2024 DOS: the patient was seen and examined on 03/02/2024 PCP: Lance Bosch, MD  Patient coming from: Home  Chief Complaint:  Chief Complaint  Patient presents with   Fall    HPI: Jerome Wolfe is a 78 y.o. male with medical history significant for Parkinson's with dementia, CVA, AAA (measuring 4.9cm 2023), hypertension, COPD, CVA 2023, history of frequent falls, ambulant with rollator at baseline, who gets homebound primary care from the [place], who lives at home with his wife who was brought in by EMS after he eas found on the ground outside in the cold beside his [name]. Patient says he lost his balance when exiting his [name] after returning from a drive and was unable to get up. According to his wife on the phone, he is not supposed to drive but he took the [place] while she was away at church.  Upon her return she did not see him but was hopeful he would return soon.  After couple hours had passed she got a call saying that he was found outside beside the [place] and was on his way to the hospital by EMS.   ED course and data review: Patient arrived and warming blankets.  He was hypothermic to 96.3, tachycardic to 106 and tachypneic to 32.  BP 168/106 with O2 sats in the mid 90s on room air.  Workup is otherwise unremarkable.  CMP and CBC unremarkable except for mild anemia of 11.9.  WBC normal at 7.9 with normal lactic acid of 1.1 CK normal at 196 Urinalysis unremarkable EKG independently interpreted showed sinus tachycardia with a right bundle branch block CT head nonacute Chest x-ray chronic changes without acute abnormality  Patient was found to be wheezing in the ED and he was treated with DuoNebs and methylprednisolone He remained tachycardic and tachypneic following treatment. Hospitalist consulted for admission for COPD exacerbation and failure to thrive    Past Medical History:  Diagnosis Date    Hypertension    Parkinson's disease    Stroke (HCC) 2021   Past Surgical History:  Procedure Laterality Date   NO PAST SURGERIES     Social History:  reports that he quit smoking about 13 years ago. His smoking use included cigarettes. He has never used smokeless tobacco. He reports that he does not drink alcohol and does not use drugs.  No Known Allergies  Family History  Problem Relation Age of Onset   Dementia Mother    Heart attack Father 12    Prior to Admission medications   Medication Sig Start Date End Date Taking? Authorizing Provider  albuterol (VENTOLIN HFA) 108 (90 Base) MCG/ACT inhaler Inhale 2 puffs into the lungs every 4 (four) hours as needed for wheezing. 07/21/19   [provider]  amLODipine (NORVASC) 5 MG tablet Take 1 tablet (5 mg total) by mouth daily. 08/24/19   Enid Baas, MD  aspirin EC 81 MG EC tablet Take 1 tablet (81 mg total) by mouth daily. 08/25/19   Enid Baas, MD  atorvastatin (LIPITOR) 80 MG tablet Take 1 tablet (80 mg total) by mouth daily at 6 PM. 08/24/19   Enid Baas, MD  budesonide-formoterol [facility]) 160-4.5 MCG/ACT inhaler Inhale 2 puffs into the lungs 2 (two) times daily.    [provider]  cholecalciferol (VITAMIN D3) 25 MCG (1000 UT) tablet Take 1,000 Units by mouth daily.    [provider]  clopidogrel (PLAVIX) 75  MG tablet Take 1 tablet (75 mg total) by mouth daily. For 3 weeks and stop 08/25/19   Enid Baas, MD  dorzolamide (TRUSOPT) 2 % ophthalmic solution Place 1 drop into both eyes 3 (three) times daily.    [provider]  ferrous sulfate 325 (65 FE) MG tablet Take 1 tablet by mouth daily.    [provider]  fluticasone (FLONASE) 50 MCG/ACT nasal spray Place 2 sprays into both nostrils daily. 07/21/19   [provider]  HYDROcodone-acetaminophen (NORCO/VICODIN) 5-325 MG tablet Take 2 tablets by mouth every 4 (four) hours as needed. 02/06/23   Brimage,  Seward Meth, DO  latanoprost (XALATAN) 0.005 % ophthalmic solution Place 1 drop into both eyes at bedtime.    [provider]  mometasone-formoterol (DULERA) 100-5 MCG/ACT AERO Inhale 2 puffs into the lungs 2 (two) times daily.    [provider]  pyridOXINE (VITAMIN B-6) 100 MG tablet Take 100 mg by mouth daily.    [provider]  QUEtiapine (SEROQUEL) 25 MG tablet Take 12.5 mg by mouth at bedtime.  07/21/19 07/20/20  [provider]  selenium sulfide (SELSUN) 1 % LOTN Apply 1 application topically daily.    [provider]  sertraline (ZOLOFT) 50 MG tablet Take 75 mg by mouth daily. 07/21/19 07/20/20  [provider]  tamsulosin (FLOMAX) 0.4 MG CAPS capsule Take 0.4 mg by mouth daily after breakfast.    [provider]  vitamin B-12 (CYANOCOBALAMIN) 100 MCG tablet Take 100 mcg by mouth daily.    [provider]    Physical Exam: Vitals:   03/02/24 0217 03/02/24 0227 03/02/24 0300 03/02/24 0330  BP:   (!) 143/86 139/87  Pulse:  (!) 105 (!) 108 (!) 101  Resp:  (!) 26 (!) 27 (!) 25  Temp: (!) 97.3 F (36.3 C)     TempSrc: Rectal     SpO2:  98% 99% 100%  Weight:      Height:       Physical Exam Vitals and nursing note reviewed.  Constitutional:      General: He is awake. He is not in acute distress.    Appearance: He is cachectic.  HENT:     Head: Normocephalic and atraumatic.  Cardiovascular:     Rate and Rhythm: Regular rhythm. Tachycardia present.     Heart sounds: Normal heart sounds.  Pulmonary:     Effort: Tachypnea present.     Breath sounds: Normal breath sounds.  Abdominal:     Palpations: Abdomen is soft.     Tenderness: There is no abdominal tenderness.  Neurological:     General: No focal deficit present.     Mental Status: He is alert.     Labs on Admission: I have personally reviewed following labs and imaging studies  CBC: Recent Labs  Lab 03/02/24 0050  WBC 7.9  NEUTROABS 5.6  HGB 11.9*   HCT 38.3*  MCV 72.8*  PLT 174   Basic Metabolic Panel: Recent Labs  Lab 03/02/24 0050  NA 138  K 5.0  CL 107  CO2 21*  GLUCOSE 84  BUN 20  CREATININE 1.13  CALCIUM 9.2   GFR: Estimated Creatinine Clearance: 51.3 mL/min (by C-G formula based on SCr of 1.13 mg/dL). Liver Function Tests: Recent Labs  Lab 03/02/24 0050  AST 32  ALT 20  ALKPHOS 53  BILITOT 0.8  PROT 6.9  ALBUMIN 3.7   No results for input(s): "LIPASE", "AMYLASE" in the last 168 hours.  No results for input(s): "AMMONIA" in the last 168 hours. Coagulation Profile: No results for input(s): "INR", "PROTIME" in the last 168 hours. Cardiac Enzymes: Recent Labs  Lab 03/02/24 0050  CKTOTAL 196   BNP (last 3 results) No results for input(s): "PROBNP" in the last 8760 hours. HbA1C: No results for input(s): "HGBA1C" in the last 72 hours. CBG: No results for input(s): "GLUCAP" in the last 168 hours. Lipid Profile: No results for input(s): "CHOL", "HDL", "LDLCALC", "TRIG", "CHOLHDL", "LDLDIRECT" in the last 72 hours. Thyroid Function Tests: No results for input(s): "TSH", "T4TOTAL", "FREET4", "T3FREE", "THYROIDAB" in the last 72 hours. Anemia Panel: No results for input(s): "VITAMINB12", "FOLATE", "FERRITIN", "TIBC", "IRON", "RETICCTPCT" in the last 72 hours. Urine analysis:    Component Value Date/Time   COLORURINE STRAW (A) 03/02/2024 0215   APPEARANCEUR CLEAR (A) 03/02/2024 0215   LABSPEC 1.009 03/02/2024 0215   LABSPEC 1.005 01/14/2015 1220   PHURINE 7.0 03/02/2024 0215   GLUCOSEU NEGATIVE 03/02/2024 0215   GLUCOSEU NEGATIVE 01/14/2015 1220   HGBUR SMALL (A) 03/02/2024 0215   BILIRUBINUR NEGATIVE 03/02/2024 0215   KETONESUR NEGATIVE 03/02/2024 0215   PROTEINUR 30 (A) 03/02/2024 0215   NITRITE NEGATIVE 03/02/2024 0215   LEUKOCYTESUR NEGATIVE 03/02/2024 0215    Radiological Exams on Admission: CT Head Wo Contrast Result Date: 03/02/2024 CLINICAL DATA:  Altered mental status. EXAM: CT HEAD  WITHOUT CONTRAST TECHNIQUE: Contiguous axial images were obtained from the base of the skull through the vertex without intravenous contrast. RADIATION DOSE REDUCTION: This exam was performed according to the departmental dose-optimization program which includes automated exposure control, adjustment of the mA and/or kV according to patient size and/or use of iterative reconstruction technique. COMPARISON:  January 23, 2023 FINDINGS: Brain: There is mild cerebral atrophy with widening of the extra-axial spaces and ventricular dilatation. There are areas of decreased attenuation within the white matter tracts of the supratentorial brain, consistent with microvascular disease changes. Vascular: No hyperdense vessel or unexpected calcification. Skull: Normal. Negative for fracture or focal lesion. Sinuses/Orbits: No acute finding. Other: None. IMPRESSION: No acute intracranial abnormality. Electronically Signed   By: Aram Candela M.D.   On: 03/02/2024 01:39   DG Chest Port 1 View Result Date: 03/02/2024 CLINICAL DATA:  Possible sepsis EXAM: PORTABLE CHEST 1 VIEW COMPARISON:  02/06/2023 FINDINGS: Lungs are well aerated bilaterally. Cardiac shadow is stable. Scattered interstitial changes are again noted stable from the prior study. No focal infiltrate or effusion is seen. No bony abnormality is noted. IMPRESSION: Chronic changes without acute abnormality Electronically Signed   By: Alcide Clever M.D.   On: 03/02/2024 00:54     Data Reviewed: Relevant notes from primary care and specialist visits, past discharge summaries as available in EHR, including Care Everywhere. Prior diagnostic testing as pertinent to current admission diagnoses Updated medications and problem lists for reconciliation ED course, including vitals, labs, imaging, treatment and response to treatment Triage notes, nursing and pharmacy notes and ED provider's notes Notable results as noted in HPI   Assessment and Plan: * COPD  exacerbation (HCC) Scheduled and as needed nebulizers IV steroids transition to oral Antitussives, supplemental O2 if needed  Exposure to environmental cold, initial encounter Improving with warming blankets  SIRS (systemic inflammatory response syndrome) (HCC) Patient is tachycardic and tachypneic, Acute infection not suspected at this time  likely related to COPD exacerbation, rewarming Received an IV fluid bolus in the ED Continue to monitor  Parkinson's disease dementia (HCC) Continue amantadine 100 mg daily, olanzapine 2.5  mg daily, quetiapine 25 mg nightly delirium precautions  Frequent falls Protein calorie malnutrition, unspecified PT, TOC, dietary consults  Abdominal aortic aneurysm (AAA) without rupture (HCC) No acute issues suspected.  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 Observation status is judged to be reasonable and necessary in order to provide the required intensity of service to ensure the patient's safety. The patient's presenting symptoms, physical exam findings, and initial radiographic and laboratory data in the context of their medical condition is felt to place them at decreased risk for further clinical deterioration. Furthermore, it is anticipated that the patient will be medically stable for discharge from the  hospital within 2 midnights of admission.   Author: Andris Baumann, MD 03/02/2024 4:05 AM  For on call review [email or web address].

## 2024-03-02 NOTE — ED Provider Notes (Signed)
 [facility] Provider Note    Event Date/Time   First MD Initiated Contact with Patient 03/02/24 0010     (approximate)   History   Fall   HPI Jerome Wolfe is a 78 y.o. male who primarily goes to the [place] for his care and has a history of Parkinson's disease and prior CVA.  He is still primarily independent but he has a history of falls.  He presents tonight by EMS after going on some errands in his [name] and then losing his balance when he got out of the [place] at home and falling down.  He says he did not get hurt but he could not get back up or inside the house and was outside in the 30+ degree weather for about an hour before someone was able to find him.  He may have some mild underlying dementia but is primarily independent and lives with his wife.  The daughter also helps from time to time.  Patient also says he has COPD and uses at least 1 nebulizer treatment daily.  He says he feels a little bit short of breath right now but that comes and goes.     Physical Exam   Triage Vital Signs: ED Triage Vitals  Encounter Vitals Group     BP 03/02/24 0000 (!) 168/106     Systolic BP Percentile --      Diastolic BP Percentile --      Pulse Rate 03/02/24 0000 (!) 106     Resp 03/02/24 0000 (!) 32     Temp 03/02/24 0002 (!) 96.3 F (35.7 C)     Temp Source 03/02/24 0002 Rectal     SpO2 03/02/24 0000 95 %     Weight 03/02/24 0005 66.3 kg (146 lb 2.6 oz)     Height 03/02/24 0005 1.88 m (6\' 2" )     Head Circumference --      Peak Flow --      Pain Score --      Pain Loc --      Pain Education --      Exclude from Growth Chart --     Most recent vital signs: Vitals:   03/02/24 0227 03/02/24 0300  BP:  (!) 143/86  Pulse: (!) 105 (!) 108  Resp: (!) 26 (!) 27  Temp:    SpO2: 98% 99%    General: Awake, alert, answering questions. CV:  Good peripheral perfusion.  Mild tachycardia.  Normal heart sounds. Resp:  Increased respiratory rate and effort, speaking  a few words at a time and using accessory muscles and having intercostal retractions.  Lungs have mild end expiratory wheezing more noticeable on the right than the left but present minimally bilaterally. Abd:  No distention.  Soft with no tenderness to palpation of the abdomen Other:  No appreciable focal neurological deficits   ED Results / Procedures / Treatments   Labs (all labs ordered are listed, but only abnormal results are displayed) Labs Reviewed  COMPREHENSIVE METABOLIC PANEL - Abnormal; Notable for the following components:      Result Value   CO2 21 (*)    All other components within normal limits  CBC WITH DIFFERENTIAL/PLATELET - Abnormal; Notable for the following components:   Hemoglobin 11.9 (*)    HCT 38.3 (*)    MCV 72.8 (*)    MCH 22.6 (*)    RDW 16.9 (*)    All other components within normal limits  URINALYSIS,  W/ REFLEX TO CULTURE (INFECTION SUSPECTED) - Abnormal; Notable for the following components:   Color, Urine STRAW (*)    APPearance CLEAR (*)    Hgb urine dipstick SMALL (*)    Protein, ur 30 (*)    All other components within normal limits  CULTURE, BLOOD (SINGLE)  CK  LACTIC ACID, PLASMA     EKG  ED ECG REPORT I, Loleta Rose, the attending physician, personally viewed and interpreted this ECG.  Date: 03/02/2024 EKG Time: 00: 19 Rate: 103 Rhythm: Sinus tachycardia QRS Axis: normal Intervals: Right bundle branch block ST/T Wave abnormalities: Non-specific ST segment / T-wave changes, but no clear evidence of acute ischemia. Narrative Interpretation: no definitive evidence of acute ischemia; does not meet STEMI criteria.    RADIOLOGY I viewed and interpret the patient's head CT and I see no evidence of intracranial bleed or CVA.  I also viewed and interpreted the patient's 1 view chest x-ray.  I see no evidence of pneumonia or pneumothorax or pulmonary edema.  I also read the radiologist's report, which confirmed no acute findings, just  some chronic changes that are stable.   PROCEDURES:  Critical Care performed: No  .1-3 Lead EKG Interpretation  Performed by: Loleta Rose, MD Authorized by: Loleta Rose, MD     Interpretation: abnormal     ECG rate:  105   ECG rate assessment: tachycardic     Rhythm: sinus tachycardia     Ectopy: none     Conduction: normal       IMPRESSION / MDM / ASSESSMENT AND PLAN / ED COURSE  I reviewed the triage vital signs and the nursing notes.                              Differential diagnosis includes, but is not limited to, nonspecific fall, hypothermia due to exposure, rhabdomyolysis, kidney dysfunction, acute intracranial hemorrhage, CVA, dementia plus or minus delirium, UTI, electrolyte or metabolic abnormality, acute infectious process/sepsis  Patient's presentation is most consistent with acute presentation with potential threat to life or bodily function.  Labs/studies ordered: CMP, CK, lactic acid, CBC with differential, single blood culture, urinalysis, lactic acid, CT head, 1 view chest x-ray, EKG  Interventions/Medications given:  Medications  sodium chloride 0.9 % bolus 500 mL (0 mLs Intravenous Stopped 03/02/24 0116)  ipratropium-albuterol (DUONEB) 0.5-2.5 (3) MG/3ML nebulizer solution 3 mL (3 mLs Nebulization Given 03/02/24 0144)  sodium chloride 0.9 % bolus 500 mL (500 mLs Intravenous New Bag/Given 03/02/24 0326)  methylPREDNISolone sodium succinate (SOLU-MEDROL) 40 mg/mL injection 40 mg (40 mg Intravenous Given 03/02/24 0326)  ipratropium-albuterol (DUONEB) 0.5-2.5 (3) MG/3ML nebulizer solution 3 mL (3 mLs Nebulization Given 03/02/24 0326)    (Note:  hospital course my include additional interventions and/or labs/studies not listed above.)   Patient's rectal temperature is 96.3 but he has been outside for about an hour in the cold weather.  Vital signs are otherwise generally reassuring but he developed a little bit of shortness of breath since he arrived.  He does  not seem concerned about it but he does have some expiratory wheezing and accessory muscle usage.  We will treat with a DuoNeb and I will reassess.  Patient's head CT and chest x-ray are normal.  CBC is reassuring and lactic acid and CK are normal.  CMP is also essentially normal as well.  I reviewed the medical record and see that he has a history of falls  in the past.  The patient said he will provide [place] a urine specimen and if he is able to return back to his baseline after getting warmed up and after reassuring workup, anticipate he should be able to be discharged for outpatient follow-up.  The patient is on the cardiac monitor to evaluate for evidence of arrhythmia and/or significant heart rate changes.   Clinical Course as of 03/02/24 0340  Mon Mar 02, 2024  0305 I reassessed the patient.  He is still tachycardic at around 110.  He has still slightly labored breathing but it is better than before with no more expiratory wheezing, just some increased rate and accessory muscle usage.  However, the patient is not able to stand up on his own.  His nurse, Fleet Contras, verified that she had to fully support him when try to get a urine specimen and ended up performing an in and out catheterization because the patient could not stand up in order to provide a specimen.  I reassessed his legs and he said that he has little bit of pain in the lower thigh on his right side after his fall, but he has no palpable deformity, no tenderness to palpation, and no pain with passive range of motion of both of his legs.  However, given his persistent tachycardia and respiratory distress and inability to ambulate, I talked with him about staying in the hospital.  He would rather go home, but I also understand that he has a history of dementia.  He gave me permission to call and talk with his wife. [CF]  0306 I called and spoke with the patient's wife, Jerome Wolfe.  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  He managed to acquire the keys while she was at church and she was surprised and concerned when she got home and found that he was not there.  She said that they have 5-day a week caregivers that come in and help but it is still getting increasingly difficult to manage his situation.  I explained that we should probably admit him to the hospital for his COPD and persistent tachycardia and inability to ambulate, but I also explained that as a [place] patient he should be transferred to [facility].  She adamantly declined this and said that it would be extremely hard to manage his situation and that she has no way to get to [facility] so she declines transfer to the [place] and would like him to be admitted to [facility].  She pointed out he has been admitted to [facility] multiple times in the past, although the last time was in 2020.  I explained to her and she understands that the way that the [place] will handle the benefits and payment for this service is unclear.  I will initiate additional treatment with a dose of Solu-Medrol 40 mg IV for presumed COPD exacerbation, ordered another 500 L of IV fluid, and consult the hospitalist for admission for the reasons described above. [CF]  1308 Consulted Dr. Para March with the hospitalist service who will admit. [CF]    Clinical Course User Index [CF] Loleta Rose, MD     FINAL CLINICAL IMPRESSION(S) / ED DIAGNOSES   Final diagnoses:  Fall, initial encounter  COPD exacerbation (HCC)  Generalized weakness  Unable to ambulate  Parkinson's disease, unspecified whether dyskinesia present, unspecified whether manifestations fluctuate (HCC)  Dementia due to Parkinson's disease, unspecified dementia severity, unspecified whether behavioral,  psychotic, or mood disturbance or anxiety (HCC)     Rx / DC Orders   ED Discharge Orders     None        Note:  This document was prepared using Dragon voice recognition  software and may include unintentional dictation errors.   Loleta Rose, MD 03/02/24 [phone]

## 2024-03-02 NOTE — ED Notes (Signed)
 Dr Fran Lowes notified and aware of pts brief having light pink tint with the urine

## 2024-03-02 NOTE — Assessment & Plan Note (Signed)
 Patient is tachycardic and tachypneic, Acute infection not suspected at this time  likely related to COPD exacerbation, rewarming Received an IV fluid bolus in the ED Continue to monitor

## 2024-03-02 NOTE — Evaluation (Addendum)
 Physical Therapy Evaluation Patient Details Name: Jerome Wolfe MRN: [id number] DOB: 07/05/1946 Today's Date: 03/02/2024  History of Present Illness  presented to ER secondary to fall, found down outside near vehicle; admitted for management of COPD exacerbation.  Clinical Impression  Patient resting on stretcher in ED upon arrival to bed-space.  Patient alert and oriented to basic information (self, location, situation, year); follows simple commands and agreeable to participation with treatment session. Mild delay in response time; pleasant and cooperative throughout session.  Endorses generalized soreness (FACES 4/10) to bilat thighs/LEs at rest; unchanged with movement or WBing.  Bilat UE/LE strength and ROM grossly [facility] for basic transfers and gait; no focal weakness appreciated.  Did note increased edema to distal LEs (2-3+ mid-calf distally). Currently requiring min/mod assist for bed mobility; min assist for sit/stand with RW; min/mod assist for gait (15') with RW.  Demonstrates step to gait pattern with narrowed BOS, impaired step height/length, difficulty with R LE foot flat on floor. Very slow and deliberate; significant posterior lean with turns and changes of direction, min/mod assist to recover and prevent fall  Of note, vitals stable and WFL throughout.  Negative orthostatics; sats >92% on RA at rest and with mobility efforts. Would benefit from skilled PT to address above deficits and promote optimal return to PLOF.; recommend post-acute PT follow up as indicated by interdisciplinary care team.            If plan is discharge home, recommend the following: A lot of help with walking and/or transfers;A lot of help with bathing/dressing/bathroom   Can travel by private vehicle   Yes    Equipment Recommendations    Recommendations for Other Services       Functional Status Assessment Patient has had a recent decline in their functional status and demonstrates the ability to make  significant improvements in function in a reasonable and predictable amount of time.     Precautions / Restrictions Precautions Precautions: Fall Restrictions Weight Bearing Restrictions Per Provider Order: No      Mobility  Bed Mobility Overal bed mobility: Needs Assistance Bed Mobility: Supine to Sit, Sit to Supine     Supine to sit: Contact guard, Min assist Sit to supine: Min assist, Mod assist   General bed mobility comments: assist for LE management    Transfers Overall transfer level: Needs assistance Equipment used: Rolling walker (2 wheels) Transfers: Sit to/from Stand Sit to Stand: Min assist                Ambulation/Gait Ambulation/Gait assistance: Min assist, Mod assist Gait Distance (Feet): 15 Feet Assistive device: Rolling walker (2 wheels)         General Gait Details: step to gait pattern with narrowed BOS, impaired step height/length, difficulty with R LE foot flat on floor.  Very slow and deliberate; significant posterior lean with turns and changes of direction, min/mod assist to recover and prevent fall  Stairs            Wheelchair Mobility     Tilt Bed    Modified Rankin (Stroke Patients Only)       Balance Overall balance assessment: Needs assistance Sitting-balance support: No upper extremity supported, Feet supported Sitting balance-Leahy Scale: Good     Standing balance support: Bilateral upper extremity supported Standing balance-Leahy Scale: Poor  Pertinent Vitals/Pain Pain Assessment Pain Assessment: Faces Faces Pain Scale: Hurts little more Pain Location: bilat LEs Pain Descriptors / Indicators: Sore Pain Intervention(s): Limited activity within patient's tolerance, Monitored during session, Repositioned    Home Living Family/patient expects to be discharged to:: Private residence Living Arrangements: Spouse/significant other Available Help at Discharge:  Family Type of Home: House Home Access: Stairs to enter Entrance Stairs-Rails: Can reach both Entrance Stairs-Number of Steps: 3-4   Home Layout: One level Home Equipment: Agricultural consultant (2 wheels);Rollator (4 wheels)      Prior Function Prior Level of Function : Independent/Modified Independent;History of Falls (last six months)             Mobility Comments: Sup/mod indep for household mobilization with RW; does endorse multiple fall history.       Extremity/Trunk Assessment   Upper Extremity Assessment Upper Extremity Assessment: Overall WFL for tasks assessed    Lower Extremity Assessment Lower Extremity Assessment: Overall WFL for tasks assessed (grossly 4-/5 throughout; 2-3+ pitting edema distal LEs)       Communication        Cognition Arousal: Alert Behavior During Therapy: WFL for tasks assessed/performed   PT - Cognitive impairments: History of cognitive impairments                       PT - Cognition Comments: Alert and oriented to self, location (as hospital), situation/events leading to admission, year; follows simple commands with increased time for processing; pleasant and cooperative throughout eval         Cueing       General Comments      Exercises     Assessment/Plan    PT Assessment Patient needs continued PT services  PT Problem List Decreased strength;Decreased range of motion;Decreased activity tolerance;Decreased balance;Decreased mobility;Decreased coordination;Decreased cognition;Decreased knowledge of use of DME;Decreased safety awareness;Decreased knowledge of precautions       PT Treatment Interventions DME instruction;Gait training;Functional mobility training;Stair training;Therapeutic activities;Therapeutic exercise;Balance training;Cognitive remediation;Patient/family education    PT Goals (Current goals can be found in the Care Plan section)  Acute Rehab PT Goals Patient Stated Goal: to go back home PT  Goal Formulation: With patient Time For Goal Achievement: 03/16/24 Potential to Achieve Goals: Good    Frequency Min 1X/week     Co-evaluation               AM-PAC PT "6 Clicks" Mobility  Outcome Measure Help needed turning from your back to your side while in a flat bed without using bedrails?: A Little Help needed moving from lying on your back to sitting on the side of a flat bed without using bedrails?: A Lot Help needed moving to and from a bed to a chair (including a wheelchair)?: A Little Help needed standing up from a chair using your arms (e.g., wheelchair or bedside chair)?: A Little Help needed to walk in hospital room?: A Lot Help needed climbing 3-5 steps with a railing? : A Lot 6 Click Score: 15    End of Session Equipment Utilized During Treatment: Gait belt Activity Tolerance: Patient tolerated treatment well Patient left: in bed;with call bell/phone within reach Nurse Communication: Mobility status PT Visit Diagnosis: Muscle weakness (generalized) (M62.81);Difficulty in walking, not elsewhere classified (R26.2)    Time: 1449-1510 PT Time Calculation (min) (ACUTE ONLY): 21 min   Charges:   PT Evaluation $PT Eval Moderate Complexity: 1 Mod   PT General Charges $$ ACUTE PT VISIT: 1 Visit  Logan Vegh H. Manson Passey, PT, DPT, NCS 03/02/24, 4:02 PM [phone]

## 2024-03-02 NOTE — Hospital Course (Signed)
 Marland Kitchen

## 2024-03-02 NOTE — Assessment & Plan Note (Signed)
 Continue senna and docusate

## 2024-03-02 NOTE — Assessment & Plan Note (Signed)
 Improving with warming blankets

## 2024-03-02 NOTE — Assessment & Plan Note (Signed)
 Continue lisinopril 20mg  daily

## 2024-03-02 NOTE — IPAL (Signed)
  Interdisciplinary Goals of Care Family Meeting   Date carried out: 03/02/2024  Location of the meeting: Phone conference  Member's involved: Physician and Family Member or next of kin  Durable Power of Attorney or acting medical decision maker: wife, Areg Bialas    Discussion: We discussed goals of care for Jerome Wolfe .   I have reviewed medical records including EPIC notes, labs and imaging, assessed the patient and then met with wife on phone to discuss major active diagnoses, plan of care, natural trajectory, prognosis, GOC, EOL wishes, disposition and options including Full code/DNI/DNR and the concept of comfort care if DNR is elected. Questions and concerns were addressed. They are  in agreement to continue current plan of care . Election for DNR/DNI status.   Code status:   Code Status: Limited: Do not attempt resuscitation (DNR) -DNR-LIMITED -Do Not Intubate/DNI    Disposition: Continue current acute care  Time spent for the meeting: 31    Andris Baumann, MD  03/02/2024, 4:08 AM

## 2024-03-02 NOTE — Assessment & Plan Note (Signed)
-  Continue atorvastatin 40 mg daily and aspirin 81 mg daily

## 2024-03-03 DIAGNOSIS — J441 Chronic obstructive pulmonary disease with (acute) exacerbation: Principal | ICD-10-CM

## 2024-03-03 DIAGNOSIS — E43 Unspecified severe protein-calorie malnutrition: Secondary | ICD-10-CM | POA: Insufficient documentation

## 2024-03-03 DIAGNOSIS — J449 Chronic obstructive pulmonary disease, unspecified: Secondary | ICD-10-CM | POA: Diagnosis present

## 2024-03-03 MED ORDER — ENSURE ENLIVE PO LIQD: 237.0000 mL | Freq: Three times a day (TID) | ORAL | Status: AC

## 2024-03-03 MED ORDER — ENSURE ENLIVE PO LIQD
237.0000 mL | Freq: Three times a day (TID) | ORAL | Status: DC
Start: 2024-03-03 — End: 2024-03-03

## 2024-03-03 MED ORDER — ADULT MULTIVITAMIN W/MINERALS CH
1.0000 | ORAL_TABLET | Freq: Every day | ORAL | Status: DC
  Administered 2024-03-03: 1 via ORAL
  Filled 2024-03-03: qty 1

## 2024-03-03 NOTE — Progress Notes (Signed)
 Initial Nutrition Assessment  DOCUMENTATION CODES:   Severe malnutrition in context of chronic illness  INTERVENTION:   -Liberalize diet to regular for widest variety of meal selections -MVI with minerals daily -Ensure Enlive po TID, each supplement provides 350 kcal and 20 grams of protein.   NUTRITION DIAGNOSIS:   Severe Malnutrition related to chronic illness (COPD, dementia, Parkinson's) as evidenced by severe fat depletion, severe muscle depletion.  GOAL:   Patient will meet greater than or equal to 90% of their needs  MONITOR:   PO intake, Supplement acceptance  REASON FOR ASSESSMENT:   Consult Assessment of nutrition requirement/status  ASSESSMENT:   Pt with medical history significant for Parkinson's with dementia, CVA, AAA (measuring 4.9cm 2023), hypertension, COPD, CVA 2023, history of frequent falls, ambulant with rollator at baseline, who gets homebound primary care from the [place], who lives at home with his wife who was brought in after he was found on the ground outside in the cold beside his [name].  Pt admitted with COPD exacerbation.  Reviewed I/O's: +40 ml x 24 hours   Per MD notes, pt is a DNR/DNI; family hopeful for placement.   No family present at time of visit. Spoke with pt at bedside, who was pleasant and in good spirits today. Pt being shaved by nursing student and reports that he is grateful for this. Pt shares that he is feeling well and is hungry; he consumed only one meal yesterday and is awaiting breakfast.   Per pt, he has a good appetite at home. He consumes 3 meals per day. Per pt, he receives food from [employer] Whe[employer] and typically likes what he is given. Pt shares that he has multiple missing teeth and sometimes has difficulty with harder textured foods; discussed possibility of a mechanically altered diet, however, pt prefers to be able to have more expanded food choices so he can pick and choose what he feels he is able to eat.   Per pt, he  has not difficulty swallowing. He does not drink much, but reports he consumes 2 Ensures daily PTA.    Per pt, his UBW is around 185#. He estimates he is down to around 135#. He explains that he has progressively lost weight since he has a stroke about 3 years ago. Reviewed wt hx; noted distant history of weight loss. However, no recent wt loss available to assess acute weight changes.   Discussed importance of good meal and supplement intake to promote healing. Pt amenable to supplements.   Medications reviewed and include lovenox, senokot, and prednisone.   Labs reviewed.    NUTRITION - FOCUSED PHYSICAL EXAM:  Flowsheet Row Most Recent Value  Orbital Region Severe depletion  Upper Arm Region Severe depletion  Thoracic and Lumbar Region Severe depletion  Buccal Region Severe depletion  Temple Region Severe depletion  Clavicle Bone Region Severe depletion  Clavicle and Acromion Bone Region Severe depletion  Scapular Bone Region Severe depletion  Dorsal Hand Severe depletion  Patellar Region Severe depletion  Anterior Thigh Region Severe depletion  Posterior Calf Region Severe depletion  Edema (RD Assessment) Mild  Hair Reviewed  Eyes Reviewed  Mouth Reviewed  Skin Reviewed  Nails Reviewed       Diet Order:   Diet Order             Diet regular Room service appropriate? Yes; Fluid consistency: Thin  Diet effective now  EDUCATION NEEDS:   Education needs have been addressed  Skin:  Skin Assessment: Reviewed RN Assessment  Last BM:  03/02/24  Height:   Ht Readings from Last 1 Encounters:  03/02/24 6\' 2"  (1.88 m)    Weight:   Wt Readings from Last 1 Encounters:  03/02/24 66.3 kg    Ideal Body Weight:  86.4 kg  BMI:  Body mass index is 18.77 kg/m.  Estimated Nutritional Needs:   Kcal:  2150-2350  Protein:  120-135 grams  Fluid:  > 2 L    Levada Schilling, RD, LDN, CDCES Registered Dietitian III Certified Diabetes Care and  Education Specialist If unable to reach this RD, please use "RD Inpatient" group chat on secure chat between hours of 8am-4 pm daily

## 2024-03-03 NOTE — Discharge Summary (Signed)
 Physician Discharge Summary   Jerome Wolfe  male DOB: 05-Jun-1946  ZOX:[id number]  PCP: Lance Bosch, MD  Admit date: 03/01/2024 Discharge date: 03/03/2024  Admitted From: home Disposition:  home.  Declined SNF rehab. Wife updated on the phone prior to discharge. Home Health: Yes CODE STATUS: DNR   Hospital Course:  For full details, please see H&P, progress notes, consult notes and ancillary notes.  Briefly,  Jerome Wolfe is a 78 y.o. male with medical history significant for Parkinson's with dementia, CVA, AAA (measuring 4.9cm 2023), hypertension, COPD, CVA 2023, history of frequent falls, who was brought in by EMS after he was found on the ground outside in the cold beside his [name].   Patient said he lost his balance when exiting his [name] after returning from a drive and was unable to get up. According to his wife on the phone, he is not supposed to drive but he took the [place] while she was away at church.    Hypothermia 2/2 Exposure to environmental cold, initial encounter --did not appear to suffer any consequence from the cold.  Body temp normalized after warming blankets.   Parkinson's disease dementia (HCC) Continue amantadine 100 mg daily   Frequent falls --pt declined SNF rehab  Protein calorie malnutrition, severe --supplements per dietician   Abdominal aortic aneurysm (AAA) without rupture (HCC) No acute issues suspected.  Last measured 4.9 cm in 2023   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) --not taking ASA or statin PTA   BPH (benign prostatic hyperplasia) Continue Proscar 5 mg daily and tamsulosin 0.4 mg daily   Hypertension Continue lisinopril 20 mg daily --not taking amlodipine PTA  * COPD exacerbation, ruled out --cont home bronchodilators   Unless noted above, medications under "STOP" list are ones pt was not taking PTA.  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 The score is based on dignosis, age, lab data, medications, orders, and past utilization.   Low:  0-14.9   Medium: 15-21.9   High: 22-29.9   Extreme: 30 and above         Discharge Instructions:  Allergies as of 03/03/2024   No Known Allergies      Medication List     STOP taking these medications    amLODipine 5 MG tablet Commonly known as: NORVASC   aspirin EC 81 MG tablet   ferrous sulfate 325 (65 FE) MG tablet   QUEtiapine 25 MG tablet Commonly known as: SEROQUEL   sertraline 50 MG tablet Commonly known as: ZOLOFT       TAKE these medications    amantadine 100 MG capsule Commonly known as: SYMMETREL Take  100 mg by mouth 2 (two) times daily.   chlorhexidine 0.12 % solution Commonly known as: PERIDEX Take 15 mLs by mouth 2 (two) times daily as needed.   cholecalciferol 25 MCG (1000 UNIT) tablet Commonly known as: VITAMIN D3 Take 2,000 Units by mouth daily.   feeding supplement Liqd Take 237 mLs by mouth 3 (three) times daily between meals.   finasteride 5 MG tablet Commonly known as: PROSCAR Take 5 mg by mouth at bedtime.   fluticasone 50 MCG/ACT nasal spray Commonly known as:  FLONASE Place 2 sprays into both nostrils daily.   guaifenesin 100 MG/5ML syrup Commonly known as: ROBITUSSIN Take 10 mLs by mouth 2 (two) times daily as needed.   ipratropium-albuterol 0.5-2.5 (3) MG/3ML Soln Commonly known as: DUONEB Take 3 mLs by nebulization 2 (two) times daily.   lisinopril 20 MG tablet Commonly known as: ZESTRIL Take 10 mg by mouth at bedtime.   mometasone-formoterol 100-5 MCG/ACT Aero Commonly known as: DULERA Inhale 2 puffs into the lungs 2 (two) times daily.   pyridOXINE 100 MG tablet Commonly known as: VITAMIN B6 Take 100 mg by mouth daily.   Quintabs Tabs Take 1 tablet by mouth daily.   REMEDY CLEANSING BODY EX Apply 1 application  topically as directed.   REMEDY SKIN REPAIR EX Apply 1 application  topically as directed.   selenium sulfide 1 % Lotn Commonly known as: SELSUN Apply 1 application topically daily.   senna-docusate 8.6-50 MG tablet Commonly known as: Senokot-S Take 2 tablets by mouth daily. What changed: Another medication with the same name was removed. Continue taking this medication, and follow the directions you see here.   spironolactone 25 MG tablet Commonly known as: ALDACTONE Take 12.5 mg by mouth once a week.   Symbicort 160-4.5 MCG/ACT inhaler Generic drug: budesonide-formoterol Inhale 2 puffs into the lungs 2 (two) times daily.   tamsulosin 0.4 MG Caps capsule Commonly known as: FLOMAX Take 0.8 mg by mouth daily after supper.   Zinc Oxide 16 % Oint Apply 1 application  topically as directed.         Follow-up Information     Lance Bosch, MD Follow up in 1 week(s).   Specialty: Geriatric Medicine Contact information: [id number] [facility] [address] [phone]                 No Known Allergies   The results of significant diagnostics from this hospitalization (including imaging, microbiology, ancillary and laboratory) are listed below for  reference.   Consultations:   Procedures/Studies: CT Head Wo Contrast Result Date: 03/02/2024 CLINICAL DATA:  Altered mental status. EXAM: CT HEAD WITHOUT CONTRAST TECHNIQUE: Contiguous axial images were obtained from the base of the skull through the vertex without intravenous contrast. RADIATION DOSE REDUCTION: This exam was performed according to the departmental dose-optimization program which includes automated exposure control, adjustment of the mA and/or kV according to patient size and/or use of iterative reconstruction technique. COMPARISON:  January 23, 2023 FINDINGS: Brain: There is mild cerebral atrophy with widening of the extra-axial spaces and ventricular dilatation. There are areas of decreased attenuation within the white matter tracts of the supratentorial brain, consistent with microvascular disease changes. Vascular: No hyperdense vessel or unexpected calcification. Skull: Normal. Negative for fracture or focal lesion. Sinuses/Orbits: No acute finding. Other: None. IMPRESSION: No acute intracranial abnormality. Electronically Signed   By: Aram Candela M.D.   On: 03/02/2024 01:39   [facility]  1 View Result Date: 03/02/2024 CLINICAL DATA:  Possible sepsis EXAM: PORTABLE CHEST 1 VIEW COMPARISON:  02/06/2023 FINDINGS: Lungs are well aerated bilaterally. Cardiac shadow is stable. Scattered interstitial changes are again noted stable from the prior study. No focal infiltrate or effusion is seen. No bony abnormality is noted. IMPRESSION: Chronic changes without acute abnormality Electronically Signed   By: Alcide Clever M.D.   On: 03/02/2024 00:54      Labs: BNP (last 3 results) No results for input(s): "BNP" in the last 8760 hours. Basic Metabolic Panel: Recent Labs  Lab 03/02/24 0050  NA 138  K 5.0  CL 107  CO2 21*  GLUCOSE 84  BUN 20  CREATININE 1.13  CALCIUM 9.2   Liver Function Tests: Recent Labs  Lab 03/02/24 0050  AST 32  ALT 20  ALKPHOS 53  BILITOT 0.8   PROT 6.9  ALBUMIN 3.7   No results for input(s): "LIPASE", "AMYLASE" in the last 168 hours. No results for input(s): "AMMONIA" in the last 168 hours. CBC: Recent Labs  Lab 03/02/24 0050  WBC 7.9  NEUTROABS 5.6  HGB 11.9*  HCT 38.3*  MCV 72.8*  PLT 174   Cardiac Enzymes: Recent Labs  Lab 03/02/24 0050  CKTOTAL 196   BNP: Invalid input(s): "POCBNP" CBG: No results for input(s): "GLUCAP" in the last 168 hours. D-Dimer No results for input(s): "DDIMER" in the last 72 hours. Hgb A1c No results for input(s): "HGBA1C" in the last 72 hours. Lipid Profile No results for input(s): "CHOL", "HDL", "LDLCALC", "TRIG", "CHOLHDL", "LDLDIRECT" in the last 72 hours. Thyroid function studies No results for input(s): "TSH", "T4TOTAL", "T3FREE", "THYROIDAB" in the last 72 hours.  Invalid input(s): "FREET3" Anemia work up No results for input(s): "VITAMINB12", "FOLATE", "FERRITIN", "TIBC", "IRON", "RETICCTPCT" in the last 72 hours. Urinalysis    Component Value Date/Time   COLORURINE STRAW (A) 03/02/2024 0215   APPEARANCEUR CLEAR (A) 03/02/2024 0215   LABSPEC 1.009 03/02/2024 0215   LABSPEC 1.005 01/14/2015 1220   PHURINE 7.0 03/02/2024 0215   GLUCOSEU NEGATIVE 03/02/2024 0215   GLUCOSEU NEGATIVE 01/14/2015 1220   HGBUR SMALL (A) 03/02/2024 0215   BILIRUBINUR NEGATIVE 03/02/2024 0215   KETONESUR NEGATIVE 03/02/2024 0215   PROTEINUR 30 (A) 03/02/2024 0215   NITRITE NEGATIVE 03/02/2024 0215   LEUKOCYTESUR NEGATIVE 03/02/2024 0215   Sepsis Labs Recent Labs  Lab 03/02/24 0050  WBC 7.9   Microbiology Recent Results (from the past 240 hours)  Blood culture (routine single)     Status: None (Preliminary result)   Collection Time: 03/02/24 12:50 AM   Specimen: Right Antecubital; Blood  Result Value Ref Range Status   Specimen Description RIGHT ANTECUBITAL  Final   Special Requests   Final    BOTTLES DRAWN AEROBIC AND ANAEROBIC Blood Culture results may not be optimal due to  an inadequate volume of blood received in culture bottles   Culture   Final    NO GROWTH 1 DAY Performed at [facility], [address], [place], [place] [address]    Report Status PENDING  Incomplete     Total time spend on discharging this patient, including the last patient exam, discussing the hospital stay, instructions for ongoing care as it relates to all pertinent caregivers, as well as preparing the medical discharge records, prescriptions, and/or referrals as applicable, is 45 minutes.    Darlin Priestly, MD  [facility] 03/03/2024, 4:07 PM

## 2024-03-03 NOTE — Progress Notes (Signed)
 Patient and spouse Jerome Wolfe ) was given written and verbal instruction, patient have been transported by EMS to home, no distress when leaving the floor.

## 2024-03-03 NOTE — TOC Initial Note (Addendum)
 Transition of Care [facility]) - Initial/Assessment Note    Patient Details  Name: Jerome Wolfe MRN: [id number] Date of Birth: 05/27/1946  [facility] of [facility]) CM/SW Contact:    Marlowe Sax, RN Phone Number: 03/03/2024, 12:42 PM  Clinical Narrative:                 Met with the patient at the bedside, he refuses to go to STR, He lives at home with his spouse and has [employer]W and [employer] at home, still drives, He fell when getting into his [name] and laid there for about an hour prior to getting ahold of his wife,  He is agreeable to Home health services and does not have a preference  Recently had [facility] and is agreeable to have them again April the daughter called and asked for me to call back,  I called, she stated that there was an APS report done, she reports that he can't go home, She stated that he is not supposed to be driving but has been driving,  I explained that he is alert and oriented and unless she is able to encourage him to go to STR We have to legally do what he asked unless he is deemed to not have capacity or if APS takes over guardianship or a protective order, she stated understanding  Expected Discharge Plan: Home w Home Health Services Barriers to Discharge: Continued Medical Work up   Patient Goals and CMS Choice Patient states their goals for this hospitalization and ongoing recovery are:: go home          Expected Discharge Plan and Services   Discharge Planning Services: CM Consult   Living arrangements for the past 2 months: Single Family Home                   DME Agency: NA                  Prior Living Arrangements/Services Living arrangements for the past 2 months: Single Family Home Lives with:: Spouse Patient language and need for interpreter reviewed:: Yes Do you feel safe going back to the place where you live?: Yes      Need for Family Participation in Patient Care: Yes (Comment) Care giver support system in place?: Yes  (comment) Current home services: DME (rolling walker) Criminal Activity/Legal Involvement Pertinent to Current Situation/Hospitalization: No - Comment as needed  Activities of Daily Living   ADL Screening (condition at time of admission) Independently performs ADLs?: Yes (appropriate for developmental age) Is the patient deaf or have difficulty hearing?: No Does the patient have difficulty seeing, even when wearing glasses/contacts?: No Does the patient have difficulty concentrating, remembering, or making decisions?: Yes  Permission Sought/Granted                  Emotional Assessment Appearance:: Appears stated age Attitude/Demeanor/Rapport: Engaged Affect (typically observed): Accepting Orientation: : Oriented to Self, Oriented to Place, Oriented to  Time, Oriented to Situation Alcohol / Substance Use: Not Applicable Psych Involvement: No (comment)  Admission diagnosis:  COPD exacerbation (HCC) [J44.1] Generalized weakness [R53.1] Falls [R29.6] Fall, initial encounter [W19.XXXA] Unable to ambulate [R26.2] Dementia due to Parkinson's disease, unspecified dementia severity, unspecified whether behavioral, psychotic, or mood disturbance or anxiety (HCC) [G20.A1, F02.80] Parkinson's disease, unspecified whether dyskinesia present, unspecified whether manifestations fluctuate (HCC) [G20.A1] Patient Active Problem List   Diagnosis Date Noted   Protein-calorie malnutrition, severe 03/03/2024   COPD exacerbation (HCC) 03/02/2024  Exposure to environmental cold, initial encounter 03/02/2024   Protein calorie malnutrition (HCC) 03/02/2024   SIRS (systemic inflammatory response syndrome) (HCC) 03/02/2024   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) 03/02/2024   Parkinson's disease dementia (HCC) 03/02/2024   Frequent falls 03/02/2024   BPH (benign prostatic hyperplasia) 03/02/2024   Constipation 03/02/2024   Falls 03/02/2024   Hypertension    Stroke  (cerebrum) (HCC) 08/23/2019   Slurred speech 08/22/2019   Abdominal aortic aneurysm (AAA) without rupture (HCC) 07/21/2019   Hemiplga following cerebral infrc aff right dominant side (HCC) 01/01/2000   PCP:  Lance Bosch, MD Pharmacy:   [id number] - [place], [place] - [address] AT [facility] [address] [place] [address] Phone: [phone] Fax: [phone]  [id number] [place] - [address] AT [facility] OF [address] [facility] [place] [address] Phone: [phone] Fax: [phone]     Social Drivers of Health (SDOH) Social History: SDOH Screenings   Food Insecurity: No Food Insecurity (03/02/2024)  Housing: Low Risk  (03/02/2024)  Transportation Needs: No Transportation Needs (03/02/2024)  Utilities: Not At Risk (03/02/2024)  Financial Resource Strain: Low Risk  (04/09/2023)   Received from [facility]  Physical Activity: Insufficiently Active (12/28/2020)   Received from [facility], [facility]  Social Connections: Socially Isolated (03/02/2024)  Tobacco Use: Medium Risk (03/02/2024)  Health Literacy: Low Risk  (12/28/2020)   Received from [facility], [facility]   SDOH Interventions:     Readmission Risk Interventions     No data to display

## 2024-03-03 NOTE — Care Management Obs Status (Signed)
 MEDICARE OBSERVATION STATUS NOTIFICATION   Patient Details  Name: Jerome Wolfe MRN: [id number] Date of Birth: [birth year]   Medicare Observation Status Notification Given:  Yes    Marlowe Sax, RN 03/03/2024, 4:40 PM

## 2024-03-03 NOTE — TOC Progression Note (Signed)
 Transition of Care [facility]) - Progression Note    Patient Details  Name: Jerome Wolfe MRN: [id number] Date of Birth: July 21, 1946  [facility]) CM/SW Contact  Marlowe Sax, RN Phone Number: 03/03/2024, 4:08 PM  Clinical Narrative:     Wife at the bedside, nurses with 3 assist unable to get the patient out of chair back to bed, patient has extreme weakness, he has [facility] to help care for him a PCS service, [facility] called to transport to [address]  Expected Discharge Plan: Home w Home Health Services Barriers to Discharge: Continued Medical Work up  Expected Discharge Plan and Services   Discharge Planning Services: CM Consult   Living arrangements for the past 2 months: Single Family Home Expected Discharge Date: 03/03/24                 DME Agency: NA       HH Arranged: PT, OT HH Agency: [facility]   Time HH Agency Contacted: 1246 Representative spoke with at [facility] Agency: Freight forwarder   Social Determinants of Health (SDOH) Interventions SDOH Screenings   Food Insecurity: No Food Insecurity (03/02/2024)  Housing: Low Risk  (03/02/2024)  Transportation Needs: No Transportation Needs (03/02/2024)  Utilities: Not At Risk (03/02/2024)  Financial Resource Strain: Low Risk  (04/09/2023)   [facility] from [facility]  Physical Activity: Insufficiently Active (12/28/2020)   Received from [facility], [facility]  Social Connections: Socially Isolated (03/02/2024)  Tobacco Use: Medium Risk (03/02/2024)  Health Literacy: Low Risk  (12/28/2020)   Received from [facility], [facility]    Readmission Risk Interventions     No data to display

## 2024-03-03 NOTE — Progress Notes (Signed)
   03/03/24 1515  Spiritual Encounters  Type of Visit Initial  Care provided to: Family  Referral source Chaplain assessment  Reason for visit Routine spiritual support  OnCall Visit No  Interventions  Spiritual Care Interventions Made Established relationship of care and support;Compassionate presence  Intervention Outcomes  Outcomes Connection to spiritual care  Spiritual Care Plan  Spiritual Care Issues Still Outstanding No further spiritual care needs at this time (see row info)   Helped wife find her way to the patients room

## 2024-03-03 NOTE — Plan of Care (Signed)
   Problem: Activity: Goal: Risk for activity intolerance will decrease Outcome: Progressing   Problem: Nutrition: Goal: Adequate nutrition will be maintained Outcome: Progressing   Problem: Elimination: Goal: Will not experience complications related to urinary retention Outcome: Progressing   Problem: Pain Managment: Goal: General experience of comfort will improve and/or be controlled Outcome: Progressing

## 2024-03-07 LAB — CULTURE, BLOOD (SINGLE): Culture: NO GROWTH

## 2024-06-22 ENCOUNTER — Encounter: Payer: Self-pay | Admitting: Emergency Medicine

## 2024-06-22 ENCOUNTER — Emergency Department
Admission: EM | Admit: 2024-06-22 | Discharge: 2024-06-23 | Disposition: A | Discharge status: Home / Self Care | Attending: Emergency Medicine | Admitting: Emergency Medicine

## 2024-06-22 ENCOUNTER — Other Ambulatory Visit: Payer: Self-pay

## 2024-06-22 DIAGNOSIS — J449 Chronic obstructive pulmonary disease, unspecified: Secondary | ICD-10-CM | POA: Diagnosis not present

## 2024-06-22 DIAGNOSIS — Z79899 Other long term (current) drug therapy: Secondary | ICD-10-CM | POA: Insufficient documentation

## 2024-06-22 DIAGNOSIS — E162 Hypoglycemia, unspecified: Secondary | ICD-10-CM | POA: Diagnosis not present

## 2024-06-22 DIAGNOSIS — I10 Essential (primary) hypertension: Secondary | ICD-10-CM | POA: Diagnosis not present

## 2024-06-22 DIAGNOSIS — F0390 Unspecified dementia without behavioral disturbance: Secondary | ICD-10-CM | POA: Insufficient documentation

## 2024-06-22 DIAGNOSIS — R339 Retention of urine, unspecified: Secondary | ICD-10-CM | POA: Insufficient documentation

## 2024-06-22 LAB — CBC WITH DIFFERENTIAL/PLATELET
Abs Immature Granulocytes: 0.01 10*3/uL (ref 0.00–0.07)
Basophils Absolute: 0.1 10*3/uL (ref 0.0–0.1)
Basophils Relative: 1 %
Eosinophils Absolute: 0.2 10*3/uL (ref 0.0–0.5)
Eosinophils Relative: 3 %
HCT: 40.8 % (ref 39.0–52.0)
Hemoglobin: 12.4 g/dL — ABNORMAL LOW (ref 13.0–17.0)
Immature Granulocytes: 0 %
Lymphocytes Relative: 39 %
Lymphs Abs: 2.2 10*3/uL (ref 0.7–4.0)
MCH: 22.1 pg — ABNORMAL LOW (ref 26.0–34.0)
MCHC: 30.4 g/dL (ref 30.0–36.0)
MCV: 72.7 fL — ABNORMAL LOW (ref 80.0–100.0)
Monocytes Absolute: 0.6 10*3/uL (ref 0.1–1.0)
Monocytes Relative: 10 %
Neutro Abs: 2.8 10*3/uL (ref 1.7–7.7)
Neutrophils Relative %: 47 %
Platelets: 184 10*3/uL (ref 150–400)
RBC: 5.61 MIL/uL (ref 4.22–5.81)
RDW: 15.9 % — ABNORMAL HIGH (ref 11.5–15.5)
WBC: 5.8 10*3/uL (ref 4.0–10.5)
nRBC: 0 % (ref 0.0–0.2)

## 2024-06-22 LAB — COMPREHENSIVE METABOLIC PANEL WITH GFR
ALT: 17 U/L (ref 0–44)
AST: 21 U/L (ref 15–41)
Albumin: 3.7 g/dL (ref 3.5–5.0)
Alkaline Phosphatase: 54 U/L (ref 38–126)
Anion gap: 7 (ref 5–15)
BUN: 16 mg/dL (ref 8–23)
CO2: 22 mmol/L (ref 22–32)
Calcium: 9 mg/dL (ref 8.9–10.3)
Chloride: 110 mmol/L (ref 98–111)
Creatinine, Ser: 1.05 mg/dL (ref 0.61–1.24)
GFR, Estimated: >60 mL/min (ref >60)
Glucose, Bld: 65 mg/dL — ABNORMAL LOW (ref 70–99)
Potassium: 3.9 mmol/L (ref 3.5–5.1)
Sodium: 139 mmol/L (ref 135–145)
Total Bilirubin: 0.2 mg/dL (ref 0.0–1.2)
Total Protein: 6.7 g/dL (ref 6.5–8.1)

## 2024-06-22 LAB — URINALYSIS, W/ REFLEX TO CULTURE (INFECTION SUSPECTED)
Bilirubin Urine: NEGATIVE
Glucose, UA: NEGATIVE mg/dL
Ketones, ur: NEGATIVE mg/dL
Leukocytes,Ua: NEGATIVE
Nitrite: NEGATIVE
Protein, ur: 100 mg/dL — AB
Specific Gravity, Urine: 1.012 (ref 1.005–1.030)
Squamous Epithelial / HPF: 0 /HPF (ref 0–5)
pH: 6 (ref 5.0–8.0)

## 2024-06-22 LAB — CBG MONITORING, ED: Glucose-Capillary: 49 mg/dL — ABNORMAL LOW (ref 70–99)

## 2024-06-22 MED ORDER — DEXTROSE 50 % IV SOLN
1.0000 | Freq: Once | INTRAVENOUS | Status: AC
  Administered 2024-06-22: 50 mL via INTRAVENOUS

## 2024-06-22 MED ORDER — DEXTROSE 50 % IV SOLN: 1.0000 | Freq: Once | INTRAVENOUS | Status: DC

## 2024-06-22 NOTE — ED Notes (Signed)
 Bladder scan perform on arrival.  277 mL resulted, but full bladder noted on scan.  Abdominal distention noted.

## 2024-06-22 NOTE — ED Triage Notes (Signed)
 Patient BIB EMS for evaluation of urinary retention.  Reports he has not been able to urinate since 8 AM this morning. Hx of urinary retention in the past.  Has Parkinson's and dementia.  Lives at home with wife. Baseline mental status per report.

## 2024-06-23 LAB — CBG MONITORING, ED
Glucose-Capillary: 101 mg/dL — ABNORMAL HIGH (ref 70–99)
Glucose-Capillary: 139 mg/dL — ABNORMAL HIGH (ref 70–99)
Glucose-Capillary: 78 mg/dL (ref 70–99)
Glucose-Capillary: 85 mg/dL (ref 70–99)
Glucose-Capillary: 85 mg/dL (ref 70–99)

## 2024-06-23 NOTE — ED Notes (Signed)
 Spoke with pt's wife and updated that pt is en route back home via [facility].

## 2024-06-23 NOTE — ED Provider Notes (Signed)
 [facility] Provider Note    Event Date/Time   First MD Initiated Contact with Patient 06/22/24 2304     (approximate)   History   Urinary Retention   HPI  Jerome Wolfe is a 78 y.o. male with history of dementia, hypertension, stroke, Parkinson's, COPD, AAA, protein calorie malnutrition, BPH who presents to the emergency department for urinary retention.  Patient reports he has not been able to urinate since 8 AM.  History of urinary retention previously.  Lives at home with his wife.  Foley catheter has already been placed and patient reports he is feeling better.  No complaints currently.   History provided by patient, no family at bedside.    Past Medical History:  Diagnosis Date   Hypertension    Parkinson's disease (HCC)    Stroke (HCC) 2021    Past Surgical History:  Procedure Laterality Date   NO PAST SURGERIES      MEDICATIONS:  Prior to Admission medications   Medication Sig Start Date End Date Taking? Authorizing Provider  amantadine  (SYMMETREL ) 100 MG capsule Take 100 mg by mouth 2 (two) times daily. 04/23/23   [provider]  budesonide-formoterol  (SYMBICORT) 160-4.5 MCG/ACT inhaler Inhale 2 puffs into the lungs 2 (two) times daily.    [provider]  chlorhexidine (PERIDEX) 0.12 % solution Take 15 mLs by mouth 2 (two) times daily as needed. 09/06/23   [provider]  cholecalciferol (VITAMIN D3) 25 MCG (1000 UT) tablet Take 2,000 Units by mouth daily.    [provider]  Dimethicone (REMEDY CLEANSING BODY EX) Apply 1 application  topically as directed.    [provider]  Dimethicone (REMEDY SKIN REPAIR EX) Apply 1 application  topically as directed. 12/02/23   [provider]  feeding supplement (ENSURE ENLIVE / ENSURE PLUS) LIQD Take 237 mLs by mouth 3 (three) times daily between meals. 03/03/24   Awanda City, MD  finasteride  (PROSCAR ) 5 MG tablet Take 5 mg by mouth at bedtime. 11/01/23    [provider]  fluticasone  (FLONASE ) 50 MCG/ACT nasal spray Place 2 sprays into both nostrils daily. 07/21/19   [provider]  guaifenesin (ROBITUSSIN) 100 MG/5ML syrup Take 10 mLs by mouth 2 (two) times daily as needed. 09/08/23   [provider]  ipratropium-albuterol  (DUONEB) 0.5-2.5 (3) MG/3ML SOLN Take 3 mLs by nebulization 2 (two) times daily. 09/08/23   [provider]  lisinopril  (ZESTRIL ) 20 MG tablet Take 10 mg by mouth at bedtime. 09/08/23   [provider]  mometasone -formoterol  (DULERA ) 100-5 MCG/ACT AERO Inhale 2 puffs into the lungs 2 (two) times daily.    [provider]  Multiple Vitamin (QUINTABS) TABS Take 1 tablet by mouth daily. 04/23/23   [provider]  pyridOXINE  (VITAMIN B-6) 100 MG tablet Take 100 mg by mouth daily.    [provider]  selenium sulfide (SELSUN) 1 % LOTN Apply 1 application topically daily.    [provider]  senna-docusate (SENOKOT-S) 8.6-50 MG tablet Take 2 tablets by mouth daily. 09/08/23   [provider]  spironolactone (ALDACTONE) 25 MG tablet Take 12.5 mg by mouth once a week. 02/25/24   [provider]  tamsulosin  (FLOMAX ) 0.4 MG CAPS capsule Take 0.8 mg by mouth daily after supper.    [provider]  Zinc Oxide 16 % OINT Apply 1 application  topically as directed. 01/24/24   [provider]    Physical Exam   Triage Vital  Signs: ED Triage Vitals [06/22/24 2207]  Encounter Vitals Group     BP (!) 167/103     Girls Systolic BP Percentile      Girls Diastolic BP Percentile      Boys Systolic BP Percentile      Boys Diastolic BP Percentile      Pulse Rate 84     Resp 16     Temp 98.6 F (37 C)     Temp Source Oral     SpO2 100 %     Weight 146 lb (66.2 kg)     Height 6' 2 (1.88 m)     Head Circumference      Peak Flow      Pain Score 0     Pain Loc      Pain Education      Exclude from Growth Chart     Most recent  vital signs: Vitals:   06/23/24 0330 06/23/24 0414  BP: (!) 151/98   Pulse: 77   Resp: 20   Temp:  98.4 F (36.9 C)  SpO2: 97%     CONSTITUTIONAL: Alert, elderly, pleasantly demented, in no distress HEAD: Normocephalic, atraumatic EYES: Conjunctivae clear, pupils appear equal, sclera nonicteric ENT: normal nose; moist mucous membranes NECK: Supple, normal ROM CARD: RRR; S1 and S2 appreciated RESP: Normal chest excursion without splinting or tachypnea; breath sounds clear and equal bilaterally; no wheezes, no rhonchi, no rales, no hypoxia or respiratory distress, speaking full sentences ABD/GI: Non-distended; soft, non-tender, no rebound, no guarding, no peritoneal signs, Foley catheter in place BACK: The back appears normal EXT: Normal ROM in all joints; no deformity noted, no edema SKIN: Normal color for age and race; warm; no rash on exposed skin NEURO: Moves all extremities equally, normal speech    ED Results / Procedures / Treatments   LABS: (all labs ordered are listed, but only abnormal results are displayed) Labs Reviewed  COMPREHENSIVE METABOLIC PANEL WITH GFR - Abnormal; Notable for the following components:      Result Value   Glucose, Bld 65 (*)    All other components within normal limits  CBC WITH DIFFERENTIAL/PLATELET - Abnormal; Notable for the following components:   Hemoglobin 12.4 (*)    MCV 72.7 (*)    MCH 22.1 (*)    RDW 15.9 (*)    All other components within normal limits  URINALYSIS, W/ REFLEX TO CULTURE (INFECTION SUSPECTED) - Abnormal; Notable for the following components:   Color, Urine YELLOW (*)    APPearance CLEAR (*)    Hgb urine dipstick SMALL (*)    Protein, ur 100 (*)    Bacteria, UA RARE (*)    All other components within normal limits  CBG MONITORING, ED - Abnormal; Notable for the following components:   Glucose-Capillary 49 (*)    All other components within normal limits  CBG MONITORING, ED - Abnormal; Notable for the following  components:   Glucose-Capillary 139 (*)    All other components within normal limits  CBG MONITORING, ED - Abnormal; Notable for the following components:   Glucose-Capillary 101 (*)    All other components within normal limits  URINE CULTURE  CBG MONITORING, ED  CBG MONITORING, ED  CBG MONITORING, ED  CBG MONITORING, ED  CBG MONITORING, ED  CBG MONITORING, ED  CBG MONITORING, ED  CBG MONITORING, ED  CBG MONITORING, ED  CBG MONITORING, ED  CBG MONITORING, ED  CBG MONITORING, ED  CBG MONITORING, ED  CBG MONITORING, ED  CBG MONITORING, ED  CBG MONITORING, ED  CBG MONITORING, ED  CBG MONITORING, ED  CBG MONITORING, ED  CBG MONITORING, ED  CBG MONITORING, ED  CBG MONITORING, ED     EKG:   RADIOLOGY: My personal review and interpretation of imaging:    I have personally reviewed all radiology reports.   No results found.   PROCEDURES:  Critical Care performed: Yes, see critical care procedure note(s)   CRITICAL CARE Performed by: Josette Sink   Total critical care time: 30 minutes  Critical care time was exclusive of separately billable procedures and treating other patients.  Critical care was necessary to treat or prevent imminent or life-threatening deterioration.  Critical care was time spent personally by me on the following activities: development of treatment plan with patient and/or surrogate as well as nursing, discussions with consultants, evaluation of patient's response to treatment, examination of patient, obtaining history from patient or surrogate, ordering and performing treatments and interventions, ordering and review of laboratory studies, ordering and review of radiographic studies, pulse oximetry and re-evaluation of patient's condition.   Procedures    IMPRESSION / MDM / ASSESSMENT AND PLAN / ED COURSE  I reviewed the triage vital signs and the nursing notes.    Patient here with complaints of urinary retention.  Foley catheter has  been placed and he reports feeling better.  Labs showed blood glucose of 65.  On recheck it is in the 40s.  He does not have a history of diabetes.     DIFFERENTIAL DIAGNOSIS (includes but not limited to):   Urinary retention secondary to BPH, UTI, postobstructive renal failure, dehydration, electrolyte derangement, failure to thrive, hypoglycemia   Patient's presentation is most consistent with acute presentation with potential threat to life or bodily function.   PLAN: Patient had Foley catheter placed prior to my arrival to the emergency department.  Per nurse that has taken over his care he put out over 300 mL but likely more.  Will try to get in touch with the nurse that placed the Foley catheter to get further information.  He states he is feeling much better.  He has had a history of urinary retention before, suspect BPH.  Lab work obtained from triage shows normal creatinine but blood glucose of 65.  On recheck it is 25.  Will give him something to eat and drink, give D50 and continue to closely monitor.  His urinalysis shows red blood cells and rare bacteria but no other sign of infection.  Culture is pending.   MEDICATIONS GIVEN IN ED: Medications  dextrose 50 % solution 50 mL (50 mLs Intravenous Given 06/22/24 2340)     ED COURSE:  Nurse reports patient put out over 400 mL after catheter placed.  Will leave catheter in place and have him follow-up with urology as an outpatient.  He verbalized understanding.  Patient's blood sugar has improved after D50 and allowing him to eat and drink.  He does tell the nursing staff that he did not eat anything today.  Will continue to monitor.  4:49 AM Blood sugar continues to be normal.  He is awaiting transport home.   At this time, I do not feel there is any life-threatening condition present. I reviewed all nursing notes, vitals, pertinent previous records.  All lab and urine results, EKGs, imaging ordered have been independently  reviewed and interpreted by myself.  I reviewed all available radiology reports from any imaging ordered this visit.  Based on my assessment, I feel the patient is safe to be discharged home without further emergent workup and can continue workup as an outpatient as needed. Discussed all findings, treatment plan as well as usual and customary return precautions.  They verbalize understanding and are comfortable with this plan.  Outpatient follow-up has been provided as needed.  All questions have been answered.   CONSULTS:  none   OUTSIDE RECORDS REVIEWED: Reviewed last admission in March 2025.       FINAL CLINICAL IMPRESSION(S) / ED DIAGNOSES   Final diagnoses:  Urinary retention  Hypoglycemia     Rx / DC Orders   ED Discharge Orders     None        Note:  This document was prepared using Dragon voice recognition software and may include unintentional dictation errors.   Britania Shreeve, Josette SAILOR, DO 06/23/24 [phone]

## 2024-06-23 NOTE — ED Notes (Signed)
 Pt given peanut butter saltines and cola

## 2024-06-23 NOTE — ED Notes (Signed)
 Called and spoke with pt's wife Jerome Wolfe, updated wife on pt's d/c status and updated on d/c instructions. Wife reports that pt will have to come home via [facility] pt is unable to stand and is bedbound. Spoke with Glenda, [employer]omatic Services operational officer to set up transportation at this time.

## 2024-06-23 NOTE — ED Notes (Signed)
 Pt given 2 orange juice and 1 pack of saltines

## 2024-06-24 LAB — URINE CULTURE: Culture: NO GROWTH

## 2024-07-01 ENCOUNTER — Emergency Department
Admission: EM | Admit: 2024-07-01 | Discharge: 2024-07-01 | Disposition: A | Discharge status: Home / Self Care | Attending: Emergency Medicine | Admitting: Emergency Medicine

## 2024-07-01 ENCOUNTER — Other Ambulatory Visit: Payer: Self-pay

## 2024-07-01 DIAGNOSIS — R339 Retention of urine, unspecified: Secondary | ICD-10-CM | POA: Diagnosis present

## 2024-07-01 DIAGNOSIS — G20A1 Parkinson's disease without dyskinesia, without mention of fluctuations: Secondary | ICD-10-CM | POA: Diagnosis not present

## 2024-07-01 DIAGNOSIS — E860 Dehydration: Secondary | ICD-10-CM | POA: Insufficient documentation

## 2024-07-01 DIAGNOSIS — I10 Essential (primary) hypertension: Secondary | ICD-10-CM | POA: Insufficient documentation

## 2024-07-01 DIAGNOSIS — Z8673 Personal history of transient ischemic attack (TIA), and cerebral infarction without residual deficits: Secondary | ICD-10-CM | POA: Insufficient documentation

## 2024-07-01 LAB — CBC WITH DIFFERENTIAL/PLATELET
Abs Immature Granulocytes: 0.04 10*3/uL (ref 0.00–0.07)
Basophils Absolute: 0.1 10*3/uL (ref 0.0–0.1)
Basophils Relative: 1 %
Eosinophils Absolute: 0.2 10*3/uL (ref 0.0–0.5)
Eosinophils Relative: 3 %
HCT: 36.4 % — ABNORMAL LOW (ref 39.0–52.0)
Hemoglobin: 11.4 g/dL — ABNORMAL LOW (ref 13.0–17.0)
Immature Granulocytes: 1 %
Lymphocytes Relative: 31 %
Lymphs Abs: 1.9 10*3/uL (ref 0.7–4.0)
MCH: 22.5 pg — ABNORMAL LOW (ref 26.0–34.0)
MCHC: 31.3 g/dL (ref 30.0–36.0)
MCV: 71.9 fL — ABNORMAL LOW (ref 80.0–100.0)
Monocytes Absolute: 0.6 10*3/uL (ref 0.1–1.0)
Monocytes Relative: 9 %
Neutro Abs: 3.5 10*3/uL (ref 1.7–7.7)
Neutrophils Relative %: 55 %
Platelets: 201 10*3/uL (ref 150–400)
RBC: 5.06 MIL/uL (ref 4.22–5.81)
RDW: 15.7 % — ABNORMAL HIGH (ref 11.5–15.5)
WBC: 6.3 10*3/uL (ref 4.0–10.5)
nRBC: 0 % (ref 0.0–0.2)

## 2024-07-01 LAB — BASIC METABOLIC PANEL WITH GFR
Anion gap: 11 (ref 5–15)
BUN: 16 mg/dL (ref 8–23)
CO2: 23 mmol/L (ref 22–32)
Calcium: 9.1 mg/dL (ref 8.9–10.3)
Chloride: 108 mmol/L (ref 98–111)
Creatinine, Ser: 1.03 mg/dL (ref 0.61–1.24)
GFR, Estimated: >60 mL/min (ref >60)
Glucose, Bld: 81 mg/dL (ref 70–99)
Potassium: 3.7 mmol/L (ref 3.5–5.1)
Sodium: 142 mmol/L (ref 135–145)

## 2024-07-01 MED ORDER — SODIUM CHLORIDE 0.9 % IV BOLUS
1000.0000 mL | Freq: Once | INTRAVENOUS | Status: AC
  Administered 2024-07-01: 1000 mL via INTRAVENOUS

## 2024-07-01 MED ORDER — SENNOSIDES-DOCUSATE SODIUM 8.6-50 MG PO TABS: 2.0000 | ORAL_TABLET | Freq: Every day | ORAL | 0 refills | Status: AC

## 2024-07-01 NOTE — ED Notes (Signed)
 RN spoke with wife Kemp Gomes to update her on husband. Per wife, pt will have to come home via [facility]. Per wife, pt cannot safely get in the house without transport and pt cannot stand without walker and maximum assistance. Wife states [facility] had to transport pt after previous visit.

## 2024-07-01 NOTE — ED Notes (Signed)
 Pt up to BR with x2 assist. Pt had urge incontinence prior to ambulating. Pt stated he thought he had to have a BM. Pt sits down on toilet and states that he does not have to go anymore. Pt changed into gown and soiled pants and shoes placed in pt belonging bag. Pt stood and peed upon standing on floor. Pt back to bed and new socks in place. Pt requesting urinary catheter. RN educated pt that he does not have need for urinary catheter at this time.

## 2024-07-01 NOTE — ED Triage Notes (Signed)
 Pt to ED from home via [facility] for difficulty urinating. Pt seen recently for same. Pt states he feels like there is pressure in his lower central abdomen and states it is difficult for him to pee. Pt stating I can't get it to come out. Pt states he had catheter removed x2 days ago for same. Last urination pt reports was last night. Pt also reports constipation x2 days.

## 2024-07-01 NOTE — ED Provider Notes (Signed)
 [facility] Provider Note    Event Date/Time   First MD Initiated Contact with Patient 07/01/24 1400     (approximate)   History   Chief Complaint: Urinary Retention   HPI  Jerome Wolfe is a 78 y.o. male with a history of hypertension, Parkinson's disease who comes ED complaining of inability to urinate since yesterday.  Has a history of urinary retention, had a Foley placed in the ED 1 week ago, which was removed by home nurse visit 2 days ago.  Patient denies fevers or chills, no pain.  Normal oral intake.   Urine culture from last week shows no growth.  Patient reports being on antibiotics currently.     Past Medical History:  Diagnosis Date   Hypertension    Parkinson's disease (HCC)    Stroke [facility]) 2021    Current Outpatient Rx   Order #: [id number] Class: Historical Med   Order #: [id number] Class: Historical Med   Order #: [id number] Class: Historical Med   Order #: [id number] Class: Historical Med   Order #: [id number] Class: Historical Med   Order #: [id number] Class: Historical Med   Order #: [id number] Class: OTC   Order #: [id number] Class: Historical Med   Order #: [id number] Class: Historical Med   Order #: [id number] Class: Historical Med   Order #: [id number] Class: Historical Med   Order #: [id number] Class: Historical Med   Order #: [id number] Class: Historical Med   Order #: [id number] Class: Historical Med   Order #: [id number] Class: Historical Med   Order #: [id number] Class: Historical Med   Order #: [id number] Class: Normal   Order #: [id number] Class: Historical Med   Order #: [id number] Class: Historical Med   Order #: [id number] Class: Historical Med    Past Surgical History:  Procedure Laterality Date   NO PAST SURGERIES      Physical Exam   Triage Vital Signs: ED Triage Vitals  Encounter Vitals Group     BP 07/01/24 1359 (!) 159/102     Girls Systolic BP Percentile --      Girls Diastolic BP Percentile --      Boys Systolic BP Percentile  --      Boys Diastolic BP Percentile --      Pulse Rate 07/01/24 1359 (!) 102     Resp 07/01/24 1359 18     Temp 07/01/24 1400 98 F (36.7 C)     Temp Source 07/01/24 1400 Oral     SpO2 07/01/24 1359 100 %     Weight 07/01/24 1400 136 lb (61.7 kg)     Height 07/01/24 1400 6' 2 (1.88 m)     Head Circumference --      Peak Flow --      Pain Score 07/01/24 1400 0     Pain Loc --      Pain Education --      Exclude from Growth Chart --     Most recent vital signs: Vitals:   07/01/24 1359 07/01/24 1400  BP: (!) 159/102   Pulse: (!) 102   Resp: 18   Temp:  98 F (36.7 C)  SpO2: 100%     General: Awake, no distress.  CV:  Good peripheral perfusion.  Resp:  Normal effort.  Abd:  No distention.  Soft, nontender Other:  Dry oral mucosa   ED Results / Procedures / Treatments   Labs (all labs ordered are listed, but only abnormal results are displayed) Labs Reviewed  CBC WITH DIFFERENTIAL/PLATELET - Abnormal; Notable for the following components:  Result Value   Hemoglobin 11.4 (*)    HCT 36.4 (*)    MCV 71.9 (*)    MCH 22.5 (*)    RDW 15.7 (*)    All other components within normal limits  BASIC METABOLIC PANEL WITH GFR     EKG    RADIOLOGY    PROCEDURES:  Procedures   MEDICATIONS ORDERED IN ED: Medications  sodium chloride  0.9 % bolus 1,000 mL (1,000 mLs Intravenous New Bag/Given 07/01/24 1420)     IMPRESSION / MDM / ASSESSMENT AND PLAN / ED COURSE  I reviewed the triage vital signs and the nursing notes.  DDx: AKI, electrolyte derangement, urinary retention  Patient's presentation is most consistent with acute presentation with potential threat to life or bodily function.  Patient presents with feeling of urinary frequency.  Vital signs unremarkable, exam reassuring.  Bedside bladder scan reveals a volume of 100 mL.  Will check labs, give IV fluids.   ----------------------------------------- 4:32 PM on  07/01/2024 ----------------------------------------- Labs normal.  With IV fluids patient has had a least 500 mL urine output, continues to feel well.  Stable for discharge      FINAL CLINICAL IMPRESSION(S) / ED DIAGNOSES   Final diagnoses:  Dehydration  Parkinson's disease without dyskinesia, unspecified whether manifestations fluctuate (HCC)     Rx / DC Orders   ED Discharge Orders          Ordered    senna-docusate (SENOKOT-S) 8.6-50 MG tablet  Daily        07/01/24 1631             Note:  This document was prepared using Dragon voice recognition software and may include unintentional dictation errors.   Viviann Pastor, MD 07/01/24 [phone]

## 2024-07-01 NOTE — ED Notes (Signed)
 [facility] called  for  transport home

## 2024-08-06 ENCOUNTER — Ambulatory Visit: Admitting: Urology

## 2024-09-30 DEATH — deceased
